# Patient Record
Sex: Male | Born: 1940
Health system: Southern US, Community
[De-identification: ages and names within clinical notes are randomized; demographics above are authoritative.]

## PROBLEM LIST (undated history)

## (undated) DIAGNOSIS — Z85828 Personal history of other malignant neoplasm of skin: Secondary | ICD-10-CM

## (undated) DIAGNOSIS — R350 Frequency of micturition: Secondary | ICD-10-CM

## (undated) DIAGNOSIS — R351 Nocturia: Secondary | ICD-10-CM

## (undated) DIAGNOSIS — C679 Malignant neoplasm of bladder, unspecified: Secondary | ICD-10-CM

## (undated) DIAGNOSIS — Z972 Presence of dental prosthetic device (complete) (partial): Secondary | ICD-10-CM

## (undated) DIAGNOSIS — Z9289 Personal history of other medical treatment: Secondary | ICD-10-CM

## (undated) HISTORY — DX: Malignant neoplasm of bladder, unspecified: C67.9

## (undated) HISTORY — PX: DUPUYTREN CONTRACTURE RELEASE: SHX1478

---

## 1998-06-28 HISTORY — PX: CATARACT EXTRACTION: SUR2

## 2006-01-13 ENCOUNTER — Ambulatory Visit: Payer: Self-pay | Admitting: Gastroenterology

## 2006-01-13 ENCOUNTER — Ambulatory Visit (HOSPITAL_COMMUNITY): Admission: RE | Admit: 2006-01-13 | Discharge: 2006-01-13 | Payer: Self-pay | Admitting: Gastroenterology

## 2010-09-15 ENCOUNTER — Other Ambulatory Visit: Payer: Self-pay

## 2010-09-15 DIAGNOSIS — D229 Melanocytic nevi, unspecified: Secondary | ICD-10-CM

## 2010-09-15 DIAGNOSIS — C4491 Basal cell carcinoma of skin, unspecified: Secondary | ICD-10-CM

## 2010-09-15 HISTORY — DX: Basal cell carcinoma of skin, unspecified: C44.91

## 2010-09-15 HISTORY — DX: Melanocytic nevi, unspecified: D22.9

## 2010-09-18 ENCOUNTER — Encounter (HOSPITAL_BASED_OUTPATIENT_CLINIC_OR_DEPARTMENT_OTHER)
Admission: RE | Admit: 2010-09-18 | Discharge: 2010-09-18 | Disposition: A | Discharge status: Home / Self Care | Payer: Medicare Other | Source: Ambulatory Visit | Attending: Orthopedic Surgery | Admitting: Orthopedic Surgery

## 2010-09-22 ENCOUNTER — Ambulatory Visit (HOSPITAL_BASED_OUTPATIENT_CLINIC_OR_DEPARTMENT_OTHER)
Admission: RE | Admit: 2010-09-22 | Discharge: 2010-09-22 | Disposition: A | Discharge status: Home / Self Care | Payer: Medicare Other | Source: Ambulatory Visit | Attending: Orthopedic Surgery | Admitting: Orthopedic Surgery

## 2010-09-22 ENCOUNTER — Other Ambulatory Visit: Payer: Self-pay | Admitting: Orthopedic Surgery

## 2010-09-22 DIAGNOSIS — M720 Palmar fascial fibromatosis [Dupuytren]: Secondary | ICD-10-CM | POA: Insufficient documentation

## 2010-09-22 DIAGNOSIS — Z01818 Encounter for other preprocedural examination: Secondary | ICD-10-CM | POA: Insufficient documentation

## 2010-12-31 ENCOUNTER — Encounter: Payer: Self-pay | Admitting: Gastroenterology

## 2011-01-15 ENCOUNTER — Ambulatory Visit: Payer: Medicare Other | Admitting: Urgent Care

## 2011-01-15 ENCOUNTER — Telehealth: Payer: Self-pay | Admitting: Urgent Care

## 2011-01-15 NOTE — Op Note (Signed)
NAME:  Curtis Patel, Curtis Patel NO.:  [id number]  MEDICAL RECORD NO.:  [id number]           PATIENT TYPE:  LOCATION:                                 FACILITY:  PHYSICIAN:  Cindee Salt, M.D.            DATE OF BIRTH:  DATE OF PROCEDURE:  09/22/2010 DATE OF DISCHARGE:                              OPERATIVE REPORT   PREOPERATIVE DIAGNOSIS:  Dupuytren contracture left ring and little fingers with first webspace cord.  POSTOPERATIVE DIAGNOSIS:  Dupuytren contracture left ring and little fingers with first webspace cord.  OPERATION:  Excision of palmar fascia, left ring, left small with VY advancements, excision of cord first webspace.  SURGEON:  Cindee Salt, MD  ASSISTANT:  Betha Loa, MD  ANESTHESIA:  Axillary general.  ANESTHESIOLOGIST:  Janetta Hora. Frederick, MD  HISTORY:  The patient is a 70 year old male with a history of Dupuytren contracture to the ring and little fingers of his left hand and a first webspace cord.  He has elected to undergo surgical excision.  He is aware of other procedures including aponeurotomy, Xiaflex collagenase injection, but has elected to undergo fasciectomy.  Pre, peri, postoperative course have been discussed along with risks and complications.  He is aware that there is no guarantee with the surgery, possibility of infection, recurrence of injury to arteries, nerves, tendons, incomplete relief of symptoms, dystrophy, the possibility of stiffness to the fingers.  In the preoperative area, the patient is seen, the extremity marked by both the patient and surgeon, antibiotic given.  PROCEDURE:  The patient was brought to the operating room where an axillary block was carried out without difficulty.  He was prepped using ChloraPrep, supine position, left arm free.  A 3-minute dry time was allowed.  A general anesthetic also given under the direction of Dr. Gelene Mink.  The limb was exsanguinated with an Esmarch bandage tourniquet  placed high on the arm was inflated to 250 mmHg.  After draping, the volar incision was made, Lorrene Reid in nature, carried down through subcutaneous tissue.  Bleeders were electrocauterized with bipolar.  The palmar fascia was identified proximally at its origin from the flexor retinaculum.  This was incised over the entire distal aspect of the flexor retinaculum.  Detaching all the palmar fascia from its proximal attachment, the neurovascular structures were identified after identifying the superficial palmar arch.  The cords to the ring and little fingers were then each isolated.  Neurovascular structures were identified.  The dissection carried distally on the little finger with a volar Brunner-type incision.  A lateral digital sheet cord coming from the abductor digiti quinti was identified.  A spiral nerve was present on the ulnar digital neurovascular bundle.  This was identified and traced through the cord excising the cord out to the level of the middle phalanx.  The radial digital artery and nerve were also identified.  The radial digital artery was of the major artery.  This was preserved along with the nerve on the radial side.  The specimen was sent to pathology. The finger came entirely straight.  A separate incision was  then made over the ring finger metacarpophalangeal joint.  The cord proximally was identified, traced distally to its attachment at the A2 pulley. Neurovascular structures were identified and protected, and the cord excised also sent to pathology.  Separate incision was then made over the first webspace, carried down through subcutaneous tissue.  The first webspace cord was identified, dissected free, and excised and sent.  The wounds were copiously irrigated with saline.  The V's converted to Y's on the little finger.  These were then copiously irrigated, sprayed with thrombin spray.  Vessel loop drains were placed into the depths of the wound, doubled over,  brought out proximally, and the wounds closed with interrupted 4-0 Vicryl Rapide sutures.  A sterile compressive dressing was applied.  On deflation of the tourniquet, all fingers immediately pinked.  A sterile splint was then attached, and the patient was taken to the recovery room for observation in satisfactory condition.  He will be discharged home to return to the [facility] of [place] in 1 week on Vicodin.          ______________________________ Cindee Salt, M.D.     GK/MEDQ  D:  09/22/2010  T:  09/23/2010  Job:  [id number]  Electronically Signed by Cindee Salt M.D. on 01/15/2011 09:04:33 AM

## 2011-01-15 NOTE — Telephone Encounter (Signed)
If new pt, please let referring MD know. Thanks

## 2012-03-28 DIAGNOSIS — Z23 Encounter for immunization: Secondary | ICD-10-CM | POA: Diagnosis not present

## 2012-07-13 DIAGNOSIS — D485 Neoplasm of uncertain behavior of skin: Secondary | ICD-10-CM | POA: Diagnosis not present

## 2012-07-13 DIAGNOSIS — L820 Inflamed seborrheic keratosis: Secondary | ICD-10-CM | POA: Diagnosis not present

## 2012-07-13 DIAGNOSIS — L570 Actinic keratosis: Secondary | ICD-10-CM | POA: Diagnosis not present

## 2012-07-13 DIAGNOSIS — Z85828 Personal history of other malignant neoplasm of skin: Secondary | ICD-10-CM | POA: Diagnosis not present

## 2012-10-18 DIAGNOSIS — L821 Other seborrheic keratosis: Secondary | ICD-10-CM | POA: Diagnosis not present

## 2012-10-18 DIAGNOSIS — L570 Actinic keratosis: Secondary | ICD-10-CM | POA: Diagnosis not present

## 2013-04-19 DIAGNOSIS — Z23 Encounter for immunization: Secondary | ICD-10-CM | POA: Diagnosis not present

## 2013-04-25 DIAGNOSIS — Z85828 Personal history of other malignant neoplasm of skin: Secondary | ICD-10-CM | POA: Diagnosis not present

## 2013-04-25 DIAGNOSIS — L821 Other seborrheic keratosis: Secondary | ICD-10-CM | POA: Diagnosis not present

## 2013-04-25 DIAGNOSIS — L570 Actinic keratosis: Secondary | ICD-10-CM | POA: Diagnosis not present

## 2013-09-19 ENCOUNTER — Ambulatory Visit (INDEPENDENT_AMBULATORY_CARE_PROVIDER_SITE_OTHER): Payer: Medicare Other | Admitting: Family Medicine

## 2013-09-19 VITALS — BP 122/60 | HR 79 | Temp 97.9°F | Resp 16 | Ht 67.0 in | Wt 139.0 lb

## 2013-09-19 DIAGNOSIS — S0180XA Unspecified open wound of other part of head, initial encounter: Secondary | ICD-10-CM | POA: Diagnosis not present

## 2013-09-19 DIAGNOSIS — Z23 Encounter for immunization: Secondary | ICD-10-CM | POA: Diagnosis not present

## 2013-09-19 DIAGNOSIS — R51 Headache: Secondary | ICD-10-CM

## 2013-09-19 DIAGNOSIS — R519 Headache, unspecified: Secondary | ICD-10-CM

## 2013-09-19 DIAGNOSIS — S0181XA Laceration without foreign body of other part of head, initial encounter: Secondary | ICD-10-CM

## 2013-09-19 NOTE — Patient Instructions (Signed)
Wound care as directed. Return for sutures to be removed in about 5-6 days  Tylenol or ibuprofen if needed for pain

## 2013-09-19 NOTE — Progress Notes (Signed)
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 A piece of lumber swung down and hit him in the right upper cheek this morning. It bled significantly. He was not knocked out. His eye was not affected.  Is last tetanus shot was at least 15 years ago. We discussed getting the TDAP and explained that the pertussis containing vaccine might not be covered by Medicare. He is willing to pay for whenever he might have to have.  Objective:  Pleasant gentleman no acute distress. Wound on his right cheek, 107mm, slightly stellate.  Moderatly deep to cheek bone.. facial movement intact  Assessment: Wound right cheek  Plan: TDAP Sutures

## 2013-09-19 NOTE — Progress Notes (Signed)
   Patient ID: GLEB MCGUIRE MRN: [id number], DOB: 1940/07/23, 73 y.o. Date of Encounter: 09/19/2013, 10:35 AM   PROCEDURE NOTE: Verbal consent obtained.  Risks and benefits of the procedure were explained. Patient made an informed decision to proceed with the procedure. Sterile technique employed. Numbing: Anesthesia obtained with 2% lidocaine with epi 2 cc for local anesthesia.   Cleansed with soap and water. Irrigated. Betadine prep per usual protocol.  Wound explored, no deep structures involved, no foreign bodies.   Wound repaired with # 7 simple interrupted sutures of Ethilon.  Hemostasis obtained. Wound cleansed and dressed.  Wound care instructions including precautions covered with patient. Handout given.  Anticipate suture removal in 5 days.   Signed, Christell Faith, MHS, PA-C [facility],  [id number] [place] [facility] 09/19/2013 10:35 AM

## 2013-09-25 ENCOUNTER — Ambulatory Visit (INDEPENDENT_AMBULATORY_CARE_PROVIDER_SITE_OTHER): Payer: Medicare Other | Admitting: Physician Assistant

## 2013-09-25 VITALS — BP 128/70 | HR 64 | Temp 97.8°F | Resp 16 | Ht 65.5 in | Wt 138.6 lb

## 2013-09-25 DIAGNOSIS — S0181XA Laceration without foreign body of other part of head, initial encounter: Secondary | ICD-10-CM

## 2013-09-25 DIAGNOSIS — S0180XA Unspecified open wound of other part of head, initial encounter: Secondary | ICD-10-CM

## 2013-09-25 NOTE — Progress Notes (Signed)
   Subjective:    Patient ID: Curtis Patel, male    DOB: 25-Apr-1941, 73 y.o.   MRN: [id number]  Suture / Staple Removal   Patient presents for suture removal. DOI 09/19/13. Wound is on the right temple. Well healing without erythema, warmth, or drainage. Patient has no other concerns or complaints.     Review of Systems  Constitutional: Negative for fever and chills.  Gastrointestinal: Negative for nausea and vomiting.  Skin: Positive for wound. Negative for color change.       Objective:   Physical Exam  Constitutional: He is oriented to person, place, and time. He appears well-developed and well-nourished.  HENT:  Head: Normocephalic and atraumatic.  Right Ear: External ear normal.  Left Ear: External ear normal.  Eyes: Conjunctivae are normal.  Neck: Normal range of motion.  Cardiovascular: Normal rate.   Pulmonary/Chest: Effort normal.  Neurological: He is alert and oriented to person, place, and time.  Skin:  Wound on right temple well healing without erythema, warmth, or drainage.   Psychiatric: He has a normal mood and affect. His behavior is normal. Judgment and thought content normal.     #7 sutures removed without difficulty. Patient tolerated well.      Assessment & Plan:   Laceration of face Sutures removed Wound well healing.  Follow up as needed.

## 2015-11-27 DIAGNOSIS — M720 Palmar fascial fibromatosis [Dupuytren]: Secondary | ICD-10-CM | POA: Diagnosis not present

## 2015-12-01 DIAGNOSIS — M720 Palmar fascial fibromatosis [Dupuytren]: Secondary | ICD-10-CM | POA: Diagnosis not present

## 2015-12-03 DIAGNOSIS — M720 Palmar fascial fibromatosis [Dupuytren]: Secondary | ICD-10-CM | POA: Diagnosis not present

## 2015-12-18 DIAGNOSIS — M720 Palmar fascial fibromatosis [Dupuytren]: Secondary | ICD-10-CM | POA: Diagnosis not present

## 2016-07-28 ENCOUNTER — Ambulatory Visit (INDEPENDENT_AMBULATORY_CARE_PROVIDER_SITE_OTHER): Payer: Medicare Other | Admitting: Family Medicine

## 2016-07-28 ENCOUNTER — Encounter: Payer: Self-pay | Admitting: Family Medicine

## 2016-07-28 ENCOUNTER — Ambulatory Visit (INDEPENDENT_AMBULATORY_CARE_PROVIDER_SITE_OTHER): Payer: Medicare Other

## 2016-07-28 VITALS — BP 136/72 | HR 65 | Temp 97.1°F | Ht 65.5 in | Wt 139.0 lb

## 2016-07-28 DIAGNOSIS — R0602 Shortness of breath: Secondary | ICD-10-CM

## 2016-07-28 DIAGNOSIS — R5383 Other fatigue: Secondary | ICD-10-CM | POA: Diagnosis not present

## 2016-07-28 DIAGNOSIS — Z136 Encounter for screening for cardiovascular disorders: Secondary | ICD-10-CM | POA: Diagnosis not present

## 2016-07-28 DIAGNOSIS — Z125 Encounter for screening for malignant neoplasm of prostate: Secondary | ICD-10-CM

## 2016-07-28 DIAGNOSIS — Z13220 Encounter for screening for lipoid disorders: Secondary | ICD-10-CM | POA: Diagnosis not present

## 2016-07-28 LAB — URINALYSIS, COMPLETE
BILIRUBIN UA: NEGATIVE
GLUCOSE, UA: NEGATIVE
KETONES UA: NEGATIVE
Leukocytes, UA: NEGATIVE
Nitrite, UA: NEGATIVE
PH UA: 7 (ref 5.0–7.5)
PROTEIN UA: NEGATIVE
SPEC GRAV UA: 1.01 (ref 1.005–1.030)
UUROB: 1 mg/dL (ref 0.2–1.0)

## 2016-07-28 LAB — MICROSCOPIC EXAMINATION
Bacteria, UA: NONE SEEN
Renal Epithel, UA: NONE SEEN /hpf
WBC UA: NONE SEEN /HPF (ref 0–?)

## 2016-07-28 NOTE — Progress Notes (Signed)
Subjective:  Patient ID: Curtis Patel, male    DOB: Feb 19, 1941  Age: 76 y.o. MRN: [id number]  CC: New Patient (Initial Visit) (pt here today to establish care but is also c/o a little SOB while hiking )   HPI Curtis Patel presents for Patient has been in remarkably good health. He has no serious medical conditions. He did have a cataract surgery about 5-8 years ago. He tells the nurse that was his right eye but he pointed to his left.  2 weeks ago today he was hiking on [place]. He was 3000 feet. The temperature was 13F or below. He developed some shortness of breath which was very unusual for him. He had to lay down and catch his breath for little bit time he felt rather dizzy. There was a retired physician with him and the physician felt that he might be having heart trouble or a stroke. However the patient after a short time perhaps 10-15 minutes and was able to get up and continue the hike. He felt rather rundown and tired compared to usual. Otherwise normal. He never had any shortness of breath after that point and he never had chest pain during the entire hike or during the incident. He hikes regularly about once a week 8-10 miles. He did not hike today because of the doctor's appointment but normally he would've been on the trail again today. The morning of the above-mentioned incident he states that he failed to hydrate properly he had a couple of cups of coffee but no water. The patient denies any loss of consciousness in spite of feeling somewhat lightheaded. He did not have any swelling. No leg pain. No nausea vomiting diarrhea. See review of systems.  History Curtis Patel has no past medical history on file.   He has a past surgical history that includes Eye surgery (Right).   His family history is not on file.He reports that he has never smoked. He has never used smokeless tobacco. He reports that he does not drink alcohol or use drugs.  No current outpatient  prescriptions on file prior to visit.   No current facility-administered medications on file prior to visit.     ROS Review of Systems  Constitutional: Negative for activity change, appetite change, chills, diaphoresis, fatigue, fever and unexpected weight change.  HENT: Negative for congestion, ear pain, hearing loss, postnasal drip, rhinorrhea, sore throat, tinnitus and trouble swallowing.   Eyes: Negative for photophobia, pain, discharge and redness.  Respiratory: Positive for shortness of breath. Negative for apnea, cough, choking, chest tightness, wheezing and stridor.   Cardiovascular: Negative for chest pain, palpitations and leg swelling.  Gastrointestinal: Negative for abdominal distention, abdominal pain, blood in stool, constipation, diarrhea, nausea and vomiting.  Endocrine: Negative for cold intolerance, heat intolerance, polydipsia, polyphagia and polyuria.  Genitourinary: Negative for difficulty urinating, dysuria, enuresis, flank pain, frequency, genital sores, hematuria and urgency.  Musculoskeletal: Negative for arthralgias and joint swelling.  Skin: Negative for color change, rash and wound.  Allergic/Immunologic: Negative for immunocompromised state.  Neurological: Negative for dizziness, tremors, seizures, syncope, facial asymmetry, speech difficulty, weakness, light-headedness, numbness and headaches.  Hematological: Does not bruise/bleed easily.  Psychiatric/Behavioral: Negative for agitation, behavioral problems, confusion, decreased concentration, dysphoric mood, hallucinations, sleep disturbance and suicidal ideas. The patient is not nervous/anxious and is not hyperactive.     Objective:  BP 136/72   Pulse 65   Temp 97.1 F (36.2 C) (Oral)   Ht 5' 5.5" (1.664 m)  Wt 139 lb (63 kg)   BMI 22.78 kg/m   Physical Exam  Constitutional: He is oriented to person, place, and time. He appears well-developed and well-nourished.  HENT:  Head: Normocephalic and  atraumatic.  Mouth/Throat: Oropharynx is clear and moist.  Eyes: EOM are normal. Pupils are equal, round, and reactive to light.  Neck: Normal range of motion. No tracheal deviation present. No thyromegaly present.  Cardiovascular: Normal rate, regular rhythm and normal heart sounds.  Exam reveals no gallop and no friction rub.   No murmur heard. Pulmonary/Chest: Breath sounds normal. He has no wheezes. He has no rales.  Abdominal: Soft. He exhibits no mass. There is no tenderness.  Musculoskeletal: Normal range of motion. He exhibits no edema.  Neurological: He is alert and oriented to person, place, and time.  Skin: Skin is warm and dry.  Psychiatric: He has a normal mood and affect.    Assessment & Plan:   Curtis Patel was seen today for new patient (initial visit).  Diagnoses and all orders for this visit:  Shortness of breath -     EKG 12-Lead -     DG Chest 2 View; Future -     CBC with Differential/Platelet -     CMP14+EGFR -     D-dimer, quantitative (not at [facility]) -     Lipid panel -     PR BREATHING CAPACITY TEST -     PSA Total (Reflex To Free) -     TSH -     Urinalysis, Complete -     ECHOCARDIOGRAM STRESS TEST; Future  Fatigue, unspecified type -     TSH  Encounter for lipid screening for cardiovascular disease -     Lipid panel  Other orders -     Microscopic Examination -     %fPSA Reflex   Curtis Patel does not currently have medications on file.  No orders of the defined types were placed in this encounter.    Follow-up: Return in about 2 weeks (around 08/11/2016).  Claretta Fraise, M.D.

## 2016-07-29 LAB — LIPID PANEL
CHOLESTEROL TOTAL: 196 mg/dL (ref 100–199)
Chol/HDL Ratio: 4.9 ratio units (ref 0.0–5.0)
HDL: 40 mg/dL (ref >39)
LDL CALC: 140 mg/dL — AB (ref 0–99)
Triglycerides: 79 mg/dL (ref 0–149)
VLDL CHOLESTEROL CAL: 16 mg/dL (ref 5–40)

## 2016-07-29 LAB — FPSA% REFLEX
% FREE PSA: 14.9 %
PSA, FREE: 0.64 ng/mL

## 2016-07-29 LAB — CBC WITH DIFFERENTIAL/PLATELET
Basophils Absolute: 0 10*3/uL (ref 0.0–0.2)
Basos: 0 %
EOS (ABSOLUTE): 0.1 10*3/uL (ref 0.0–0.4)
EOS: 1 %
HEMATOCRIT: 41 % (ref 37.5–51.0)
HEMOGLOBIN: 14.4 g/dL (ref 13.0–17.7)
Immature Grans (Abs): 0 10*3/uL (ref 0.0–0.1)
Immature Granulocytes: 0 %
LYMPHS ABS: 2.5 10*3/uL (ref 0.7–3.1)
Lymphs: 28 %
MCH: 29.6 pg (ref 26.6–33.0)
MCHC: 35.1 g/dL (ref 31.5–35.7)
MCV: 84 fL (ref 79–97)
MONOCYTES: 7 %
Monocytes Absolute: 0.6 10*3/uL (ref 0.1–0.9)
NEUTROS ABS: 5.8 10*3/uL (ref 1.4–7.0)
Neutrophils: 64 %
Platelets: 264 10*3/uL (ref 150–379)
RBC: 4.87 x10E6/uL (ref 4.14–5.80)
RDW: 13.4 % (ref 12.3–15.4)
WBC: 9 10*3/uL (ref 3.4–10.8)

## 2016-07-29 LAB — CMP14+EGFR
ALBUMIN: 4.4 g/dL (ref 3.5–4.8)
ALK PHOS: 95 IU/L (ref 39–117)
ALT: 12 IU/L (ref 0–44)
AST: 15 IU/L (ref 0–40)
Albumin/Globulin Ratio: 2.1 (ref 1.2–2.2)
BILIRUBIN TOTAL: 0.4 mg/dL (ref 0.0–1.2)
BUN / CREAT RATIO: 15 (ref 10–24)
BUN: 15 mg/dL (ref 8–27)
CO2: 22 mmol/L (ref 18–29)
CREATININE: 0.97 mg/dL (ref 0.76–1.27)
Calcium: 9.2 mg/dL (ref 8.6–10.2)
Chloride: 100 mmol/L (ref 96–106)
GFR calc non Af Amer: 76 mL/min/{1.73_m2} (ref >59)
GFR, EST AFRICAN AMERICAN: 88 mL/min/{1.73_m2} (ref >59)
GLOBULIN, TOTAL: 2.1 g/dL (ref 1.5–4.5)
Glucose: 82 mg/dL (ref 65–99)
Potassium: 4.6 mmol/L (ref 3.5–5.2)
SODIUM: 141 mmol/L (ref 134–144)
TOTAL PROTEIN: 6.5 g/dL (ref 6.0–8.5)

## 2016-07-29 LAB — D-DIMER, QUANTITATIVE (NOT AT ARMC): D-DIMER: 0.51 mg{FEU}/L — AB (ref 0.00–0.49)

## 2016-07-29 LAB — PSA TOTAL (REFLEX TO FREE): Prostate Specific Ag, Serum: 4.3 ng/mL — ABNORMAL HIGH (ref 0.0–4.0)

## 2016-07-29 LAB — TSH: TSH: 4.48 u[IU]/mL (ref 0.450–4.500)

## 2016-08-03 ENCOUNTER — Other Ambulatory Visit: Payer: Medicare Other

## 2016-08-03 DIAGNOSIS — R319 Hematuria, unspecified: Secondary | ICD-10-CM

## 2016-08-03 LAB — URINALYSIS, COMPLETE
Bilirubin, UA: NEGATIVE
Glucose, UA: NEGATIVE
Ketones, UA: NEGATIVE
Leukocytes, UA: NEGATIVE
NITRITE UA: NEGATIVE
PH UA: 5.5 (ref 5.0–7.5)
PROTEIN UA: NEGATIVE
Specific Gravity, UA: 1.01 (ref 1.005–1.030)
UUROB: 0.2 mg/dL (ref 0.2–1.0)

## 2016-08-03 LAB — MICROSCOPIC EXAMINATION
BACTERIA UA: NONE SEEN
EPITHELIAL CELLS (NON RENAL): NONE SEEN /HPF (ref 0–10)
Renal Epithel, UA: NONE SEEN /hpf

## 2016-08-05 ENCOUNTER — Encounter: Payer: Self-pay | Admitting: *Deleted

## 2016-08-17 ENCOUNTER — Ambulatory Visit (INDEPENDENT_AMBULATORY_CARE_PROVIDER_SITE_OTHER): Payer: Medicare Other | Admitting: Family Medicine

## 2016-08-17 ENCOUNTER — Encounter: Payer: Self-pay | Admitting: Family Medicine

## 2016-08-17 VITALS — BP 140/74 | HR 78 | Temp 97.4°F | Ht 65.5 in | Wt 139.4 lb

## 2016-08-17 DIAGNOSIS — R3129 Other microscopic hematuria: Secondary | ICD-10-CM

## 2016-08-17 DIAGNOSIS — R0602 Shortness of breath: Secondary | ICD-10-CM | POA: Diagnosis not present

## 2016-08-17 LAB — URINALYSIS, COMPLETE
BILIRUBIN UA: NEGATIVE
Glucose, UA: NEGATIVE
KETONES UA: NEGATIVE
Leukocytes, UA: NEGATIVE
Nitrite, UA: NEGATIVE
PROTEIN UA: NEGATIVE
UUROB: 0.2 mg/dL (ref 0.2–1.0)
pH, UA: 5 (ref 5.0–7.5)

## 2016-08-17 LAB — MICROSCOPIC EXAMINATION
Bacteria, UA: NONE SEEN
Epithelial Cells (non renal): NONE SEEN /hpf (ref 0–10)
RENAL EPITHEL UA: NONE SEEN /HPF
WBC UA: NONE SEEN /HPF (ref 0–?)

## 2016-08-17 NOTE — Progress Notes (Signed)
Subjective:  Patient ID: Curtis Curtis Patel Curtis Patel, male    DOB: December 26, 1940  Age: 76 y.o. MRN: [id number]  CC: Shortness of Breath (better) and Urinary Tract Infection (did fu urine sample)   HPI Curtis Curtis Patel Curtis Patel presents for Recheck of his episode of near syncope. This had occurred prior to his previous visit. He was noted at time of his office visit at that time that he had some hematuria microscopically. He came in for repeat several days later. And in for final today. Of note is that he has had no further episodes of shortness of breath. He has not been hiking. He is keeping himself well hydrated though. He wants to go back tomorrow to hike on the [place] near [place]. His stress test has not been performed as yet. It is scheduled for early March. He has not had any chest pain, syncope or dyspnea. He has not had any hematuria.   History Curtis Curtis Patel Curtis Patel has no past medical history on file.   He has a past surgical history that includes Eye surgery (Right).   His family history is not on file.He reports that he has never smoked. He has never used smokeless tobacco. He reports that he does not drink alcohol or use drugs.    ROS Review of Systems  Constitutional: Negative for chills, diaphoresis and fever.  HENT: Negative for rhinorrhea and sore throat.   Respiratory: Negative for cough and shortness of breath.   Cardiovascular: Negative for chest pain.  Gastrointestinal: Negative for abdominal pain.  Musculoskeletal: Negative for arthralgias and myalgias.  Skin: Negative for rash.  Neurological: Negative for weakness and headaches.    Objective:  BP 140/74   Pulse 78   Temp 97.4 F (36.3 C) (Oral)   Ht 5' 5.5" (1.664 m)   Wt 139 lb 6.4 oz (63.2 kg)   BMI 22.84 kg/m   BP Readings from Last 3 Encounters:  08/17/16 140/74  07/28/16 136/72  09/25/13 128/70    Wt Readings from Last 3 Encounters:  08/17/16 139 lb 6.4 oz (63.2 kg)  07/28/16 139 lb (63 kg)  09/25/13 138  lb 9.6 oz (62.9 kg)     Physical Exam  Constitutional: He is oriented to person, place, and time. He appears well-developed and well-nourished. No distress.  HENT:  Head: Normocephalic and atraumatic.  Right Ear: External ear normal.  Left Ear: External ear normal.  Nose: Nose normal.  Mouth/Throat: Oropharynx is clear and moist.  Eyes: Conjunctivae and EOM are normal. Pupils are equal, round, and reactive to light.  Neck: Normal range of motion. Neck supple. No thyromegaly present.  Cardiovascular: Normal rate, regular rhythm and normal heart sounds.   No murmur heard. Pulmonary/Chest: Effort normal and breath sounds normal. No respiratory distress. He has no wheezes. He has no rales.  Abdominal: Soft. Bowel sounds are normal. He exhibits no distension. There is no tenderness.  Lymphadenopathy:    He has no cervical adenopathy.  Neurological: He is alert and oriented to person, place, and time. He has normal reflexes.  Skin: Skin is warm and dry.  Psychiatric: He has a normal mood and affect. His behavior is normal. Judgment and thought content normal.    No results found.  Assessment & Plan:   Curtis Curtis Patel Curtis Patel was seen today for shortness of breath and urinary tract infection.  Diagnoses and all orders for this visit:  Other microscopic hematuria -     Urinalysis, Complete  Shortness of breath  Mr. Curtis Curtis Patel Curtis Patel does not currently have medications on file.  Allergies as of 08/17/2016   No Known Allergies     Medication List    as of 08/17/2016  1:53 PM   You have not been prescribed any medications.      Follow-up: Return in about 6 months (around 02/14/2017).  Claretta Fraise, M.D.

## 2016-08-19 ENCOUNTER — Telehealth (HOSPITAL_COMMUNITY): Payer: Self-pay | Admitting: *Deleted

## 2016-08-19 NOTE — Telephone Encounter (Signed)
Left message on voicemail per DPR in reference to upcoming appointment scheduled on 08/26/16 at 7:30 with detailed instructions given per Stress Test Requisition Sheet for the test. LM to arrive 30 minutes early, and that it is imperative to arrive on time for appointment to keep from having the test rescheduled. If you need to cancel or reschedule your appointment, please call the office within 24 hours of your appointment. Failure to do so may result in a cancellation of your appointment, and a $50 no show fee. Phone number given for call back for any questions. Curtis Patel

## 2016-08-20 ENCOUNTER — Other Ambulatory Visit: Payer: Self-pay

## 2016-08-20 DIAGNOSIS — R319 Hematuria, unspecified: Secondary | ICD-10-CM

## 2016-08-26 ENCOUNTER — Ambulatory Visit (HOSPITAL_COMMUNITY): Payer: Medicare Other | Attending: Cardiology

## 2016-08-26 ENCOUNTER — Ambulatory Visit (HOSPITAL_COMMUNITY): Payer: Medicare Other

## 2016-08-26 DIAGNOSIS — R0602 Shortness of breath: Secondary | ICD-10-CM | POA: Diagnosis not present

## 2016-08-26 DIAGNOSIS — Z9289 Personal history of other medical treatment: Secondary | ICD-10-CM

## 2016-08-26 HISTORY — DX: Personal history of other medical treatment: Z92.89

## 2016-12-28 DIAGNOSIS — R3121 Asymptomatic microscopic hematuria: Secondary | ICD-10-CM | POA: Diagnosis not present

## 2017-01-04 DIAGNOSIS — R3121 Asymptomatic microscopic hematuria: Secondary | ICD-10-CM | POA: Diagnosis not present

## 2017-01-04 DIAGNOSIS — N200 Calculus of kidney: Secondary | ICD-10-CM | POA: Diagnosis not present

## 2017-02-14 DIAGNOSIS — R3121 Asymptomatic microscopic hematuria: Secondary | ICD-10-CM | POA: Diagnosis not present

## 2017-02-16 ENCOUNTER — Other Ambulatory Visit: Payer: Self-pay | Admitting: Urology

## 2017-02-24 NOTE — Patient Instructions (Addendum)
Curtis Patel  02/24/2017   Your procedure is scheduled on: 03/09/2017    Report to [facility] Main  Entrance Take [place]  elevators to 3rd floor to  [place] at     200pm     Call this number if you have problems the morning of surgery [phone]    Remember: ONLY 1 PERSON MAY GO WITH YOU TO SHORT STAY TO GET  READY MORNING OF YOUR SURGERY.  Do not eat food after midnite.  May have clear liquids from 12 midnite until 1000am morning of surgery then nothing by mouth      Take these medicines the morning of surgery with A SIP OF WATER: none                                 You may not have any metal on your body including hair pins and              piercings  Do not wear jewelry, , lotions, powders or perfumes, deodorant                          Men may shave face and neck.   Do not bring valuables to the hospital. [place].  Contacts, dentures or bridgework may not be worn into surgery.       Patients discharged the day of surgery will not be allowed to drive home.  Name and phone number of your driver:                Please read over the following fact sheets you were given: _____________________________________________________________________             [facility] - Preparing for Surgery Before surgery, you can play an important role.  Because skin is not sterile, your skin needs to be as free of germs as possible.  You can reduce the number of germs on your skin by washing with CHG (chlorahexidine gluconate) soap before surgery.  CHG is an antiseptic cleaner which kills germs and bonds with the skin to continue killing germs even after washing. Please DO NOT use if you have an allergy to CHG or antibacterial soaps.  If your skin becomes reddened/irritated stop using the CHG and inform your nurse when you arrive at Short Stay. Do not shave (including legs and underarms) for at least 48  hours prior to the first CHG shower.  You may shave your face/neck. Please follow these instructions carefully:  1.  Shower with CHG Soap the night before surgery and the  morning of Surgery.  2.  If you choose to wash your hair, wash your hair first as usual with your  normal  shampoo.  3.  After you shampoo, rinse your hair and body thoroughly to remove the  shampoo.                           4.  Use CHG as you would any other liquid soap.  You can apply chg directly  to the skin and wash  Gently with a scrungie or clean washcloth.  5.  Apply the CHG Soap to your body ONLY FROM THE NECK DOWN.   Do not use on face/ open                           Wound or open sores. Avoid contact with eyes, ears mouth and genitals (private parts).                       Wash face,  Genitals (private parts) with your normal soap.             6.  Wash thoroughly, paying special attention to the area where your surgery  will be performed.  7.  Thoroughly rinse your body with warm water from the neck down.  8.  DO NOT shower/wash with your normal soap after using and rinsing off  the CHG Soap.                9.  Pat yourself dry with a clean towel.            10.  Wear clean pajamas.            11.  Place clean sheets on your bed the night of your first shower and do not  sleep with pets. Day of Surgery : Do not apply any lotions/deodorants the morning of surgery.  Please wear clean clothes to the hospital/surgery center.  FAILURE TO FOLLOW THESE INSTRUCTIONS MAY RESULT IN THE CANCELLATION OF YOUR SURGERY PATIENT SIGNATURE_________________________________  NURSE SIGNATURE__________________________________  ________________________________________________________________________    CLEAR LIQUID DIET   Foods Allowed                                                                     Foods Excluded  Coffee and tea, regular and decaf                             liquids that you cannot   Plain Jell-O in any flavor                                             see through such as: Fruit ices (not with fruit pulp)                                     milk, soups, orange juice  Iced Popsicles                                    All solid food Carbonated beverages, regular and diet                                    Cranberry, grape and apple juices Sports drinks like Gatorade Lightly seasoned clear broth or consume(fat free) Sugar, honey syrup  Sample Menu Breakfast                                Lunch                                     Supper Cranberry juice                    Beef broth                            Chicken broth Jell-O                                     Grape juice                           Apple juice Coffee or tea                        Jell-O                                      Popsicle                                                Coffee or tea                        Coffee or tea  _____________________________________________________________________

## 2017-03-01 ENCOUNTER — Encounter (HOSPITAL_COMMUNITY)
Admission: RE | Admit: 2017-03-01 | Discharge: 2017-03-01 | Disposition: A | Discharge status: Home / Self Care | Payer: Medicare Other | Source: Ambulatory Visit | Attending: Urology | Admitting: Urology

## 2017-03-01 ENCOUNTER — Encounter (INDEPENDENT_AMBULATORY_CARE_PROVIDER_SITE_OTHER): Payer: Self-pay

## 2017-03-01 ENCOUNTER — Encounter (HOSPITAL_COMMUNITY): Payer: Self-pay

## 2017-03-01 DIAGNOSIS — C679 Malignant neoplasm of bladder, unspecified: Secondary | ICD-10-CM | POA: Insufficient documentation

## 2017-03-01 DIAGNOSIS — Z01818 Encounter for other preprocedural examination: Secondary | ICD-10-CM | POA: Diagnosis not present

## 2017-03-01 LAB — CBC
HEMATOCRIT: 40.4 % (ref 39.0–52.0)
HEMOGLOBIN: 13.9 g/dL (ref 13.0–17.0)
MCH: 29.2 pg (ref 26.0–34.0)
MCHC: 34.4 g/dL (ref 30.0–36.0)
MCV: 84.9 fL (ref 78.0–100.0)
Platelets: 254 10*3/uL (ref 150–400)
RBC: 4.76 MIL/uL (ref 4.22–5.81)
RDW: 12.4 % (ref 11.5–15.5)
WBC: 6.7 10*3/uL (ref 4.0–10.5)

## 2017-03-01 NOTE — Progress Notes (Signed)
07/28/16-ekg-epic CXR-07/28/16-epic  ECHO-08/26/16-epic

## 2017-03-09 ENCOUNTER — Encounter (HOSPITAL_COMMUNITY): Payer: Self-pay | Admitting: Emergency Medicine

## 2017-03-09 ENCOUNTER — Ambulatory Visit (HOSPITAL_COMMUNITY): Payer: Medicare Other | Admitting: Anesthesiology

## 2017-03-09 ENCOUNTER — Encounter (HOSPITAL_COMMUNITY)
Admission: RE | Disposition: A | Discharge status: Home / Self Care | Payer: Self-pay | Source: Ambulatory Visit | Attending: Urology

## 2017-03-09 ENCOUNTER — Ambulatory Visit (HOSPITAL_COMMUNITY)
Admission: RE | Admit: 2017-03-09 | Discharge: 2017-03-09 | Disposition: A | Discharge status: Home / Self Care | Payer: Medicare Other | Source: Ambulatory Visit | Attending: Urology | Admitting: Urology

## 2017-03-09 ENCOUNTER — Ambulatory Visit (HOSPITAL_COMMUNITY): Payer: Medicare Other

## 2017-03-09 DIAGNOSIS — C672 Malignant neoplasm of lateral wall of bladder: Secondary | ICD-10-CM | POA: Diagnosis not present

## 2017-03-09 DIAGNOSIS — C670 Malignant neoplasm of trigone of bladder: Secondary | ICD-10-CM | POA: Diagnosis not present

## 2017-03-09 DIAGNOSIS — C679 Malignant neoplasm of bladder, unspecified: Secondary | ICD-10-CM | POA: Diagnosis not present

## 2017-03-09 DIAGNOSIS — Z7982 Long term (current) use of aspirin: Secondary | ICD-10-CM | POA: Diagnosis not present

## 2017-03-09 DIAGNOSIS — K219 Gastro-esophageal reflux disease without esophagitis: Secondary | ICD-10-CM | POA: Diagnosis not present

## 2017-03-09 DIAGNOSIS — D494 Neoplasm of unspecified behavior of bladder: Secondary | ICD-10-CM | POA: Diagnosis present

## 2017-03-09 DIAGNOSIS — Z87891 Personal history of nicotine dependence: Secondary | ICD-10-CM | POA: Diagnosis not present

## 2017-03-09 DIAGNOSIS — C678 Malignant neoplasm of overlapping sites of bladder: Secondary | ICD-10-CM | POA: Diagnosis not present

## 2017-03-09 HISTORY — PX: TRANSURETHRAL RESECTION OF BLADDER TUMOR: SHX2575

## 2017-03-09 HISTORY — PX: CYSTOSCOPY W/ URETERAL STENT PLACEMENT: SHX1429

## 2017-03-09 LAB — GLUCOSE, CAPILLARY: Glucose-Capillary: 96 mg/dL (ref 65–99)

## 2017-03-09 SURGERY — TURBT (TRANSURETHRAL RESECTION OF BLADDER TUMOR)
Anesthesia: General

## 2017-03-09 MED ORDER — PROPOFOL 10 MG/ML IV BOLUS
INTRAVENOUS | Status: DC | PRN
  Administered 2017-03-09: 140 mg via INTRAVENOUS

## 2017-03-09 MED ORDER — PHENYLEPHRINE 40 MCG/ML (10ML) SYRINGE FOR IV PUSH (FOR BLOOD PRESSURE SUPPORT)
PREFILLED_SYRINGE | INTRAVENOUS | Status: DC | PRN
  Administered 2017-03-09 (×2): 80 ug via INTRAVENOUS

## 2017-03-09 MED ORDER — TRAMADOL HCL 50 MG PO TABS: 50.0000 mg | ORAL_TABLET | Freq: Four times a day (QID) | ORAL | 0 refills | Status: DC | PRN

## 2017-03-09 MED ORDER — 0.9 % SODIUM CHLORIDE (POUR BTL) OPTIME
TOPICAL | Status: DC | PRN
  Administered 2017-03-09: 1000 mL

## 2017-03-09 MED ORDER — SENNOSIDES-DOCUSATE SODIUM 8.6-50 MG PO TABS: 1.0000 | ORAL_TABLET | Freq: Two times a day (BID) | ORAL | 0 refills | Status: DC

## 2017-03-09 MED ORDER — DEXAMETHASONE SODIUM PHOSPHATE 10 MG/ML IJ SOLN
INTRAMUSCULAR | Status: DC | PRN
  Administered 2017-03-09: 10 mg via INTRAVENOUS

## 2017-03-09 MED ORDER — PROPOFOL 10 MG/ML IV BOLUS
INTRAVENOUS | Status: AC
Start: 2017-03-09 — End: ?
  Filled 2017-03-09: qty 20

## 2017-03-09 MED ORDER — MEPERIDINE HCL 50 MG/ML IJ SOLN: 6.2500 mg | INTRAMUSCULAR | Status: DC | PRN

## 2017-03-09 MED ORDER — LIDOCAINE 2% (20 MG/ML) 5 ML SYRINGE
INTRAMUSCULAR | Status: DC | PRN
  Administered 2017-03-09: 100 mg via INTRAVENOUS

## 2017-03-09 MED ORDER — FENTANYL CITRATE (PF) 100 MCG/2ML IJ SOLN
INTRAMUSCULAR | Status: AC
  Filled 2017-03-09: qty 2

## 2017-03-09 MED ORDER — DEXAMETHASONE SODIUM PHOSPHATE 10 MG/ML IJ SOLN
INTRAMUSCULAR | Status: AC
  Filled 2017-03-09: qty 1

## 2017-03-09 MED ORDER — OXYCODONE HCL 5 MG/5ML PO SOLN
5.0000 mg | Freq: Once | ORAL | Status: DC | PRN
  Filled 2017-03-09: qty 5

## 2017-03-09 MED ORDER — OXYCODONE HCL 5 MG PO TABS: 5.0000 mg | ORAL_TABLET | Freq: Once | ORAL | Status: DC | PRN

## 2017-03-09 MED ORDER — SODIUM CHLORIDE 0.9 % IR SOLN
Status: DC | PRN
  Administered 2017-03-09: 9000 mL via INTRAVESICAL

## 2017-03-09 MED ORDER — ONDANSETRON HCL 4 MG/2ML IJ SOLN
INTRAMUSCULAR | Status: AC
  Filled 2017-03-09: qty 2

## 2017-03-09 MED ORDER — LACTATED RINGERS IV SOLN
INTRAVENOUS | Status: DC
  Administered 2017-03-09 (×2): via INTRAVENOUS

## 2017-03-09 MED ORDER — PROMETHAZINE HCL 25 MG/ML IJ SOLN: 6.2500 mg | INTRAMUSCULAR | Status: DC | PRN

## 2017-03-09 MED ORDER — FENTANYL CITRATE (PF) 100 MCG/2ML IJ SOLN
INTRAMUSCULAR | Status: DC | PRN
  Administered 2017-03-09: 25 ug via INTRAVENOUS
  Administered 2017-03-09: 50 ug via INTRAVENOUS
  Administered 2017-03-09: 25 ug via INTRAVENOUS

## 2017-03-09 MED ORDER — ONDANSETRON HCL 4 MG/2ML IJ SOLN
INTRAMUSCULAR | Status: DC | PRN
  Administered 2017-03-09: 4 mg via INTRAVENOUS

## 2017-03-09 MED ORDER — LIDOCAINE 2% (20 MG/ML) 5 ML SYRINGE
INTRAMUSCULAR | Status: AC
  Filled 2017-03-09: qty 5

## 2017-03-09 MED ORDER — HYDROMORPHONE HCL-NACL 0.5-0.9 MG/ML-% IV SOSY: 0.2500 mg | PREFILLED_SYRINGE | INTRAVENOUS | Status: DC | PRN

## 2017-03-09 MED ORDER — CEPHALEXIN 500 MG PO CAPS
500.0000 mg | ORAL_CAPSULE | Freq: Two times a day (BID) | ORAL | 0 refills | Status: AC
Start: 2017-03-09 — End: 2017-03-14

## 2017-03-09 MED ORDER — GENTAMICIN SULFATE 40 MG/ML IJ SOLN
5.0000 mg/kg | INTRAVENOUS | Status: AC
  Administered 2017-03-09: 300 mg via INTRAVENOUS
  Filled 2017-03-09: qty 7.5

## 2017-03-09 MED ORDER — IOHEXOL 300 MG/ML  SOLN
INTRAMUSCULAR | Status: DC | PRN
  Administered 2017-03-09: 15 mL via URETHRAL

## 2017-03-09 SURGICAL SUPPLY — 34 items
BAG URINE DRAINAGE (UROLOGICAL SUPPLIES) ×2 IMPLANT
BAG URO CATCHER STRL LF (MISCELLANEOUS) ×4 IMPLANT
BASKET ZERO TIP NITINOL 2.4FR (BASKET) IMPLANT
BSKT STON RTRVL ZERO TP 2.4FR (BASKET)
CATH FOLEY 3WAY 30CC 24FR (CATHETERS) ×4
CATH INTERMIT  6FR 70CM (CATHETERS) ×2 IMPLANT
CATH URTH STD 24FR FL 3W 2 (CATHETERS) IMPLANT
CLOTH BEACON ORANGE TIMEOUT ST (SAFETY) ×4 IMPLANT
COVER FOOTSWITCH UNIV (MISCELLANEOUS) ×2 IMPLANT
COVER SURGICAL LIGHT HANDLE (MISCELLANEOUS) ×2 IMPLANT
ELECT REM PT RETURN 15FT ADLT (MISCELLANEOUS) ×2 IMPLANT
EVACUATOR MICROVAS BLADDER (UROLOGICAL SUPPLIES) IMPLANT
FIBER LASER FLEXIVA 1000 (UROLOGICAL SUPPLIES) IMPLANT
FIBER LASER FLEXIVA 365 (UROLOGICAL SUPPLIES) IMPLANT
FIBER LASER FLEXIVA 550 (UROLOGICAL SUPPLIES) IMPLANT
FIBER LASER TRAC TIP (UROLOGICAL SUPPLIES) IMPLANT
GLOVE BIOGEL M STRL SZ7.5 (GLOVE) ×4 IMPLANT
GOWN STRL REUS W/TWL LRG LVL3 (GOWN DISPOSABLE) ×8 IMPLANT
GUIDEWIRE ANG ZIPWIRE 038X150 (WIRE) IMPLANT
GUIDEWIRE STR DUAL SENSOR (WIRE) ×4 IMPLANT
HOLDER FOLEY CATH W/STRAP (MISCELLANEOUS) ×2 IMPLANT
LOOP CUT BIPOLAR 24F LRG (ELECTROSURGICAL) ×2 IMPLANT
MANIFOLD NEPTUNE II (INSTRUMENTS) ×4 IMPLANT
NDL SAFETY ECLIPSE 18X1.5 (NEEDLE) IMPLANT
NEEDLE HYPO 18GX1.5 SHARP (NEEDLE) ×4
PACK CYSTO (CUSTOM PROCEDURE TRAY) ×4 IMPLANT
PAD TELFA 2X3 NADH STRL (GAUZE/BANDAGES/DRESSINGS) ×2 IMPLANT
PLUG CATH AND CAP STER (CATHETERS) ×2 IMPLANT
SET ASPIRATION TUBING (TUBING) IMPLANT
STENT URET 6FRX26 CONTOUR (STENTS) ×2 IMPLANT
SYRINGE IRR TOOMEY STRL 70CC (SYRINGE) IMPLANT
TUBING CONNECTING 10 (TUBING) ×3 IMPLANT
TUBING CONNECTING 10' (TUBING) ×1
WATER STERILE IRR 500ML POUR (IV SOLUTION) ×2 IMPLANT

## 2017-03-09 NOTE — Transfer of Care (Signed)
Immediate Anesthesia Transfer of Care Note  Patient: Curtis Patel  Procedure(s) Performed: Procedure(s): TRANSURETHRAL RESECTION OF BLADDER TUMOR (TURBT) (N/A) CYSTOSCOPY WITH BILATERAL RETROGRADE PYELOGRAM/ LEFT URETERAL STENT PLACEMENT (Bilateral)  Patient Location: PACU  Anesthesia Type:General  Level of Consciousness: awake, alert  and oriented  Airway & Oxygen Therapy: Patient Spontanous Breathing and Patient connected to face mask oxygen  Post-op Assessment: Report given to RN and Post -op Vital signs reviewed and stable  Post vital signs: Reviewed and stable  Last Vitals:  Vitals:   03/09/17 1356  BP: (!) 149/71  Pulse: 72  Resp: 16  Temp: 36.8 C  SpO2: 99%    Last Pain:  Vitals:   03/09/17 1356  TempSrc: Oral      Patients Stated Pain Goal: 4 (70/26/37 8588)  Complications: No apparent anesthesia complications

## 2017-03-09 NOTE — Discharge Instructions (Signed)
General Anesthesia, Adult, Care After These instructions provide you with information about caring for yourself after your procedure. Your health care provider may also give you more specific instructions. Your treatment has been planned according to current medical practices, but problems sometimes occur. Call your health care provider if you have any problems or questions after your procedure. What can I expect after the procedure? After the procedure, it is common to have:  Vomiting.  A sore throat.  Mental slowness.  It is common to feel:  Nauseous.  Cold or shivery.  Sleepy.  Tired.  Sore or achy, even in parts of your body where you did not have surgery.  Follow these instructions at home: For at least 24 hours after the procedure:  Do not: ? Participate in activities where you could fall or become injured. ? Drive. ? Use heavy machinery. ? Drink alcohol. ? Take sleeping pills or medicines that cause drowsiness. ? Make important decisions or sign legal documents. ? Take care of children on your own.  Rest. Eating and drinking  If you vomit, drink water, juice, or soup when you can drink without vomiting.  Drink enough fluid to keep your urine clear or pale yellow.  Make sure you have little or no nausea before eating solid foods.  Follow the diet recommended by your health care provider. General instructions  Have a responsible adult stay with you until you are awake and alert.  Return to your normal activities as told by your health care provider. Ask your health care provider what activities are safe for you.  Take over-the-counter and prescription medicines only as told by your health care provider.  If you smoke, do not smoke without supervision.  Keep all follow-up visits as told by your health care provider. This is important. Contact a health care provider if:  You continue to have nausea or vomiting at home, and medicines are not helpful.  You  cannot drink fluids or start eating again.  You cannot urinate after 8-12 hours.  You develop a skin rash.  You have fever.  You have increasing redness at the site of your procedure. Get help right away if:  You have difficulty breathing.  You have chest pain.  You have unexpected bleeding.  You feel that you are having a life-threatening or urgent problem. This information is not intended to replace advice given to you by your health care provider. Make sure you discuss any questions you have with your health care provider. Document Released: 09/20/2000 Document Revised: 11/17/2015 Document Reviewed: 05/29/2015 Elsevier Interactive Patient Education  2018 [place] may have urinary urgency (bladder spasms) and bloody urine on / off with stent and catheter in place. This is normal.  2 - Call MD or go to ER for fever >102, severe pain / nausea / vomiting not relieved by medications, or acute change in medical status     Indwelling Urinary Catheter Insertion, Care After This sheet gives you information about how to care for yourself after your procedure. Your health care provider may also give you more specific instructions. If you have problems or questions, contact your health care provider. What can I expect after the procedure? After the procedure, it is common to have:  Slight discomfort around your urethra where the catheter enters your body.  Follow these instructions at home:  Keep the drainage bag at or below the level of your bladder. Doing this ensures that urine can only drain out, not  back into your body.  Secure the catheter tubing and drainage bag to your leg or thigh to keep it from moving.  Check the catheter tubing regularly to make sure there are no kinks or blockages.  Take showers daily to keep the catheter clean. Do not take a bath.  Do not pull on your catheter or try to remove it.  Disconnect the tubing and drainage bag as little as  possible.  Empty the drainage bag every 2-4 hours, or more often if needed. Do not let the bag get completely full.  Wash your hands with soap and water before and after touching the catheter, tubing, or drainage bag.  Do not let the drainage bag or catheter tubing touch the floor.  Drink enough fluids to keep your urine clear or pale yellow, or as told by your health care provider. Contact a health care provider if:  Urine stops flowing into the drainage bag.  You feel pain or pressure in the bladder area.  You have back pain.  Your catheter gets clogged.  Your catheter starts to leak.  Your urine looks cloudy.  Your drainage bag or tubing looks dirty.  You notice a bad smell when emptying your drainage bag. Get help right away if:  You have a fever or chills.  You have severe pain in your back or your lower abdomen.  You have warmth, redness, swelling, or pain in the urethra area.  You notice blood in your urine.  Your catheter gets pulled out. Summary  Do not pull on your catheter or try to remove it.  Keep the drainage bag at or below the level of your bladder, but do not let the drainage bag or catheter tubing touch the floor.  Wash your hands with soap and water before and after touching the catheter, tubing, or drainage bag.  Contact your health care provider if you have a fever, chills, or any other signs of infection. This information is not intended to replace advice given to you by your health care provider. Make sure you discuss any questions you have with your health care provider. Document Released: 07/24/2016 Document Revised: 07/24/2016 Document Reviewed: 07/24/2016 Elsevier Interactive Patient Education  [employer].

## 2017-03-09 NOTE — Interval H&P Note (Signed)
History and Physical Interval Note:  03/09/2017 5:03 PM  Curtis Patel  has presented today for surgery, with the diagnosis of BLADDER CANCER  The various methods of treatment have been discussed with the patient and family. After consideration of risks, benefits and other options for treatment, the patient has consented to  Procedure(s): TRANSURETHRAL RESECTION OF BLADDER TUMOR (TURBT) (N/A) CYSTOSCOPY WITH BILATERAL RETROGRADE PYELOGRAM/ LEFT URETERAL STENT PLACEMENT (Bilateral) HOLMIUM LASER APPLICATION (N/A) as a surgical intervention .  The patient's history has been reviewed, patient examined, no change in status, stable for surgery.  I have reviewed the patient's chart and labs.  Questions were answered to the patient's satisfaction.     [name]

## 2017-03-09 NOTE — H&P (Signed)
Curtis Patel is an 76 y.o. male.    Chief Complaint: Pre-Op Transurethral Resection of Bladder Tumor  HPI:   1 - Microscopic Hematuria / Bladder Cancer - blood on UA noted by PCP x several 2018. No gross episodes. Non smoker. CT 12/2016 with some left bladder neck thickening / enhancement. Cysto 01/2017 confirms approx 4cm left wall tumro near left UO.    PMH sig for ortho surgery, cateract surgyer. NO ischemic CV disease / blood thinners. He is remarkably active and [place]. His PCP is Claretta Fraise MD.   Today " Curtis Patel " is seen to proceed with TURBT / retrogrades, likely left stent placement for new left wall / trigone bladder cancer. Most recent UA without infectious parameters.     Past Medical History:  Diagnosis Date  . GERD (gastroesophageal reflux disease)     Past Surgical History:  Procedure Laterality Date  . EYE SURGERY Right     No family history on file. Social History:  reports that he has quit smoking. He has never used smokeless tobacco. He reports that he does not drink alcohol or use drugs.  Allergies: No Known Allergies  No prescriptions prior to admission.    No results found for this or any previous visit (from the past 48 hour(s)). No results found.  Review of Systems  Constitutional: Negative.  Negative for chills and fever.  HENT: Negative.   Eyes: Negative.   Respiratory: Negative.   Cardiovascular: Negative.   Gastrointestinal: Negative.   Genitourinary: Negative.  Negative for flank pain.  Musculoskeletal: Negative.   Skin: Negative.   Neurological: Negative.   Endo/Heme/Allergies: Negative.   Psychiatric/Behavioral: Negative.     There were no vitals taken for this visit. Physical Exam  Constitutional: He appears well-developed.  HENT:  Head: Normocephalic.  Eyes: Pupils are equal, round, and reactive to light.  Neck: Normal range of motion.  Cardiovascular: Normal rate.   Respiratory:  Effort normal.  GI: Soft.  Genitourinary:  Genitourinary Comments: No CVAT.   Neurological: He is alert.  Skin: Skin is warm.  Psychiatric: He has a normal mood and affect.     Assessment/Plan  Proceed as planned with TURBT, retrogrades, likely left ureteral stent with diagnostic and theraputic intent for bladder cancer. Risks, benefits, alternatives, expected peri-op course discussed previously and reiterated today.   Alexis Frock, MD 03/09/2017, 7:36 AM

## 2017-03-09 NOTE — Anesthesia Preprocedure Evaluation (Signed)
Anesthesia Evaluation  Patient identified by MRN, date of birth, ID band Patient awake    Reviewed: Allergy & Precautions, NPO status , Patient's Chart, lab work & pertinent test results  Airway Mallampati: II  TM Distance: >3 FB Neck ROM: Full    Dental no notable dental hx.    Pulmonary former smoker,    Pulmonary exam normal breath sounds clear to auscultation       Cardiovascular negative cardio ROS Normal cardiovascular exam Rhythm:Regular Rate:Normal     Neuro/Psych negative neurological ROS  negative psych ROS   GI/Hepatic Neg liver ROS, GERD  ,  Endo/Other  negative endocrine ROS  Renal/GU negative Renal ROS     Musculoskeletal negative musculoskeletal ROS (+)   Abdominal   Peds  Hematology negative hematology ROS (+)   Anesthesia Other Findings   Reproductive/Obstetrics                             Anesthesia Physical Anesthesia Plan  ASA: II  Anesthesia Plan: General   Post-op Pain Management:    Induction: Intravenous  PONV Risk Score and Plan: 2 and Ondansetron and Dexamethasone  Airway Management Planned: LMA  Additional Equipment:   Intra-op Plan:   Post-operative Plan: Extubation in OR  Informed Consent: I have reviewed the patients History and Physical, chart, labs and discussed the procedure including the risks, benefits and alternatives for the proposed anesthesia with the patient or authorized representative who has indicated his/her understanding and acceptance.   Dental advisory given  Plan Discussed with: CRNA  Anesthesia Plan Comments:         Anesthesia Quick Evaluation

## 2017-03-09 NOTE — Brief Op Note (Signed)
03/09/2017  6:13 PM  PATIENT:  Curtis Patel  76 y.o. male  PRE-OPERATIVE DIAGNOSIS:  BLADDER CANCER  POST-OPERATIVE DIAGNOSIS:  BLADDER CANCER  PROCEDURE:  Procedure(s): TRANSURETHRAL RESECTION OF BLADDER TUMOR (TURBT) (N/A) CYSTOSCOPY WITH BILATERAL RETROGRADE PYELOGRAM/ LEFT URETERAL STENT PLACEMENT (Bilateral)  SURGEON:  Surgeon(s) and Role:    * Alexis Frock, MD - Primary  PHYSICIAN ASSISTANT:   ASSISTANTS: none   ANESTHESIA:   general  EBL:  Total I/O In: 1000 [I.V.:1000] Out: -   BLOOD ADMINISTERED:none  DRAINS: 3F foley to gravity   LOCAL MEDICATIONS USED:  NONE  SPECIMEN:  Source of Specimen:  bladder tumor, base of bladder tumor  DISPOSITION OF SPECIMEN:  PATHOLOGY  COUNTS:  YES  TOURNIQUET:  * No tourniquets in log *  DICTATION: .Other Dictation: Dictation Number [id number]  PLAN OF CARE: Discharge to home after PACU  PATIENT DISPOSITION:  PACU - hemodynamically stable.   Delay start of Pharmacological VTE agent (>24hrs) due to surgical blood loss or risk of bleeding: yes

## 2017-03-09 NOTE — Anesthesia Postprocedure Evaluation (Signed)
Anesthesia Post Note  Patient: Curtis Patel  Procedure(s) Performed: Procedure(s) (LRB): TRANSURETHRAL RESECTION OF BLADDER TUMOR (TURBT) (N/A) CYSTOSCOPY WITH BILATERAL RETROGRADE PYELOGRAM/ LEFT URETERAL STENT PLACEMENT (Bilateral)     Patient location during evaluation: PACU Anesthesia Type: General Level of consciousness: sedated and patient cooperative Pain management: pain level controlled Vital Signs Assessment: post-procedure vital signs reviewed and stable Respiratory status: spontaneous breathing Cardiovascular status: stable Anesthetic complications: no    Last Vitals:  Vitals:   03/09/17 1827 03/09/17 1845  BP: (!) 160/84 (!) 142/80  Pulse: (!) 56 61  Resp: 16 12  Temp:    SpO2: 100% 100%    Last Pain:  Vitals:   03/09/17 1356  TempSrc: Oral                 Nolon Nations

## 2017-03-09 NOTE — Anesthesia Procedure Notes (Signed)
Procedure Name: LMA Insertion Date/Time: 03/09/2017 5:19 PM Performed by: Noralyn Pick D Pre-anesthesia Checklist: Patient identified, Emergency Drugs available, Suction available and Patient being monitored Patient Re-evaluated:Patient Re-evaluated prior to induction Oxygen Delivery Method: Circle system utilized Preoxygenation: Pre-oxygenation with 100% oxygen Induction Type: IV induction Ventilation: Mask ventilation without difficulty LMA: LMA inserted LMA Size: 4.0 Tube type: Oral Number of attempts: 1 Placement Confirmation: positive ETCO2 and breath sounds checked- equal and bilateral Tube secured with: Tape Dental Injury: Teeth and Oropharynx as per pre-operative assessment

## 2017-03-10 ENCOUNTER — Encounter (HOSPITAL_COMMUNITY): Payer: Self-pay | Admitting: Urology

## 2017-03-10 NOTE — Op Note (Signed)
NAME:  NELSON, NOONE                    ACCOUNT NO.:  MEDICAL RECORD NO.:  [id number]  LOCATION:                                 FACILITY:  PHYSICIAN:  Alexis Frock, MD          DATE OF BIRTH:  DATE OF PROCEDURE: 03/09/2017                              OPERATIVE REPORT   DIAGNOSIS:  Large-volume bladder cancer.  PROCEDURES: 1. Transurethral resection of bladder tumor, volume large. 2. Bilateral retrograde pyelograms with interpretation. 3. Insertion of left ureteral stent, 6 x 26, Contour, no tether.  ESTIMATED BLOOD LOSS:  Nil.  COMPLICATION:  None.  SPECIMENS: 1. Bladder tumor. 2. Base of bladder tumor for permanent pathology.  FINDINGS: 1. Large volume papillary bladder tumor, query muscle invasive     involving left lateral trigone and wall, very close, but not     directly involving left ureteral orifice. 2. Unremarkable bilateral retrograde pyelograms. 3. Unremarkable left retrograde pyelogram following transurethral     resection of bladder tumor. 4. Successful placement of left ureteral stent, proximal in the renal     pelvis and distal in the bladder.  INDICATION:  Mr. Malizia is a very pleasant and very vigorous 76 year old gentleman without significant past medical history, who on workup of microscopic hematuria, was found to have a very large-volume bladder cancer, this was clinically localized.  This was very close to his left ureteral orifice without hydronephrosis.  Options were discussed for initial management including recommended past this operative transurethral resection for diagnostic and staging purposes with possible left ureteral stenting given the proximity of the tumor to his left ureteral orifice and he wished to proceed.  Informed consent was obtained and placed in the medical record.  PROCEDURE IN DETAIL:  The patient being, Italo Banton, was verified. Procedure being transurethral resection of bladder tumor, bilateral retrograde  pyelograms and left ureteral stent placement was confirmed. Procedure was carried out.  Time-out was performed.  Intravenous antibiotics were administered.  General LMA anesthesia induced.  The patient was placed into a low lithotomy position.  Sterile field was created by prepping and draping the patient's penis, perineum and proximal thighs using iodine.  Next, cystourethroscopy was performed using a 22-French rigid cystoscope with offset lens.  Inspection of the anterior and posterior unremarkable.  Inspection of the urinary bladder with a very large-volume papillary bladder tumor just lateral to left ureteral orifice and encompassing some of the left wall, total surface area approximately 7 cm2.  Photodocumentation performed.  Attention was directed to retrograde pyelogram.  The right ureteral orifice was cannulated with a 6-French end-hole catheter and right retrograde pyelogram was obtained.  Right retrograde pyelogram demonstrated a single right ureter with single-system right kidney.  No filling defects or narrowing noted. Similarly, left retrograde pyelogram was obtained.  Left retrograde pyelogram demonstrated a single left ureter with single- system left kidney.  No filling defects or narrowing noted.  The cystoscope was then exchanged for the 26-French resectoscope sheath with visual obturator and using resectoscope loop.  Very careful systematic resection was performed with bladder tumor down what appeared to be the superficial fibromuscular stroma of the bladder, taking exquisite  care to avoid injury to the left ureteral orifice, which did not occur grossly.  This bladder tumor was irrigated and set aside for permanent pathology, labeled as bladder tumor.  Next, cold cup biopsy forceps were used to obtain representative seromuscular deep bites, set aside as labeled base of bladder tumor.  The edges and base of this were carefully fulgurated, which resulted in excellent  hemostasis.  Given the proximity of the tumor and resection to the left ureteral orifice, it was felt that interval stenting would be warranted to prevent progression and malignant obstruction.  As I am concerned that this may be muscle invasive disease, as such, an additional left retrograde pyelogram was obtained, which revealed no extravasation along the left ureter and a 0.038 Sensor wire was advanced to the level of the upper pole, over which, a new 6 x 26 Contour-type stent was placed using cystoscopic and fluoroscopic guidance.  Given the large-volume tumor resection, it was felt that brief interval catheterization would be warranted.  As such, a new 24-French 3-way Foley catheter was placed per urethra to straight drain, 10 mL of sterile water in the balloon.  The irrigation port was plugged.  Efflux was very light pink and procedure was terminated.  The patient tolerated the procedure well.  There were no immediate periprocedural complications.  The patient was taken to the postanesthesia care unit in stable condition.          ______________________________ Alexis Frock, MD     TM/MEDQ  D:  03/09/2017  T:  03/10/2017  Job:  [id number]

## 2017-03-11 ENCOUNTER — Encounter: Payer: Self-pay | Admitting: Family Medicine

## 2017-03-11 DIAGNOSIS — C679 Malignant neoplasm of bladder, unspecified: Secondary | ICD-10-CM

## 2017-03-11 HISTORY — DX: Malignant neoplasm of bladder, unspecified: C67.9

## 2017-03-14 DIAGNOSIS — C676 Malignant neoplasm of ureteric orifice: Secondary | ICD-10-CM | POA: Diagnosis not present

## 2017-03-14 DIAGNOSIS — R310 Gross hematuria: Secondary | ICD-10-CM | POA: Diagnosis not present

## 2017-03-28 DIAGNOSIS — R3121 Asymptomatic microscopic hematuria: Secondary | ICD-10-CM | POA: Diagnosis not present

## 2017-03-28 DIAGNOSIS — C670 Malignant neoplasm of trigone of bladder: Secondary | ICD-10-CM | POA: Diagnosis not present

## 2017-03-28 DIAGNOSIS — R300 Dysuria: Secondary | ICD-10-CM | POA: Diagnosis not present

## 2017-04-01 ENCOUNTER — Other Ambulatory Visit: Payer: Self-pay | Admitting: Urology

## 2017-04-18 ENCOUNTER — Encounter (HOSPITAL_BASED_OUTPATIENT_CLINIC_OR_DEPARTMENT_OTHER): Payer: Self-pay | Admitting: *Deleted

## 2017-04-19 ENCOUNTER — Encounter (HOSPITAL_BASED_OUTPATIENT_CLINIC_OR_DEPARTMENT_OTHER): Payer: Self-pay | Admitting: *Deleted

## 2017-04-19 NOTE — Progress Notes (Signed)
NPO AFTER MN.  ARRIVE AT 0800.  NEEDS ISTAT 8.

## 2017-04-25 DIAGNOSIS — N3000 Acute cystitis without hematuria: Secondary | ICD-10-CM | POA: Diagnosis not present

## 2017-04-25 DIAGNOSIS — C670 Malignant neoplasm of trigone of bladder: Secondary | ICD-10-CM | POA: Diagnosis not present

## 2017-04-25 DIAGNOSIS — R300 Dysuria: Secondary | ICD-10-CM | POA: Diagnosis not present

## 2017-04-27 ENCOUNTER — Encounter (HOSPITAL_BASED_OUTPATIENT_CLINIC_OR_DEPARTMENT_OTHER)
Admission: RE | Disposition: A | Discharge status: Home / Self Care | Payer: Self-pay | Source: Ambulatory Visit | Attending: Urology

## 2017-04-27 ENCOUNTER — Ambulatory Visit (HOSPITAL_BASED_OUTPATIENT_CLINIC_OR_DEPARTMENT_OTHER): Payer: Medicare Other | Admitting: Anesthesiology

## 2017-04-27 ENCOUNTER — Encounter (HOSPITAL_BASED_OUTPATIENT_CLINIC_OR_DEPARTMENT_OTHER): Payer: Self-pay

## 2017-04-27 ENCOUNTER — Ambulatory Visit (HOSPITAL_BASED_OUTPATIENT_CLINIC_OR_DEPARTMENT_OTHER)
Admission: RE | Admit: 2017-04-27 | Discharge: 2017-04-27 | Disposition: A | Discharge status: Home / Self Care | Payer: Medicare Other | Source: Ambulatory Visit | Attending: Urology | Admitting: Urology

## 2017-04-27 DIAGNOSIS — C677 Malignant neoplasm of urachus: Secondary | ICD-10-CM | POA: Diagnosis not present

## 2017-04-27 DIAGNOSIS — Z87891 Personal history of nicotine dependence: Secondary | ICD-10-CM | POA: Diagnosis not present

## 2017-04-27 DIAGNOSIS — N3010 Interstitial cystitis (chronic) without hematuria: Secondary | ICD-10-CM | POA: Diagnosis not present

## 2017-04-27 DIAGNOSIS — C679 Malignant neoplasm of bladder, unspecified: Secondary | ICD-10-CM | POA: Diagnosis not present

## 2017-04-27 DIAGNOSIS — Z85828 Personal history of other malignant neoplasm of skin: Secondary | ICD-10-CM | POA: Insufficient documentation

## 2017-04-27 DIAGNOSIS — Z8551 Personal history of malignant neoplasm of bladder: Secondary | ICD-10-CM | POA: Diagnosis not present

## 2017-04-27 DIAGNOSIS — C678 Malignant neoplasm of overlapping sites of bladder: Secondary | ICD-10-CM | POA: Diagnosis not present

## 2017-04-27 HISTORY — DX: Personal history of other medical treatment: Z92.89

## 2017-04-27 HISTORY — DX: Presence of dental prosthetic device (complete) (partial): Z97.2

## 2017-04-27 HISTORY — PX: TRANSURETHRAL RESECTION OF BLADDER TUMOR: SHX2575

## 2017-04-27 HISTORY — DX: Nocturia: R35.1

## 2017-04-27 HISTORY — DX: Frequency of micturition: R35.0

## 2017-04-27 HISTORY — PX: CYSTOSCOPY W/ URETERAL STENT PLACEMENT: SHX1429

## 2017-04-27 HISTORY — DX: Personal history of other malignant neoplasm of skin: Z85.828

## 2017-04-27 LAB — POCT I-STAT, CHEM 8
BUN: 25 mg/dL — AB (ref 6–20)
CALCIUM ION: 1.14 mmol/L — AB (ref 1.15–1.40)
CHLORIDE: 98 mmol/L — AB (ref 101–111)
Creatinine, Ser: 1.1 mg/dL (ref 0.61–1.24)
Glucose, Bld: 108 mg/dL — ABNORMAL HIGH (ref 65–99)
HCT: 39 % (ref 39.0–52.0)
Hemoglobin: 13.3 g/dL (ref 13.0–17.0)
Potassium: 3 mmol/L — ABNORMAL LOW (ref 3.5–5.1)
SODIUM: 136 mmol/L (ref 135–145)
TCO2: 24 mmol/L (ref 22–32)

## 2017-04-27 SURGERY — TURBT (TRANSURETHRAL RESECTION OF BLADDER TUMOR)
Anesthesia: Monitor Anesthesia Care

## 2017-04-27 MED ORDER — DEXAMETHASONE SODIUM PHOSPHATE 10 MG/ML IJ SOLN
INTRAMUSCULAR | Status: AC
  Filled 2017-04-27: qty 1

## 2017-04-27 MED ORDER — PHENYLEPHRINE 40 MCG/ML (10ML) SYRINGE FOR IV PUSH (FOR BLOOD PRESSURE SUPPORT)
PREFILLED_SYRINGE | INTRAVENOUS | Status: AC
  Filled 2017-04-27: qty 10

## 2017-04-27 MED ORDER — ONDANSETRON HCL 4 MG/2ML IJ SOLN
INTRAMUSCULAR | Status: DC | PRN
  Administered 2017-04-27: 4 mg via INTRAVENOUS

## 2017-04-27 MED ORDER — LACTATED RINGERS IV SOLN
INTRAVENOUS | Status: DC
  Administered 2017-04-27 (×2): via INTRAVENOUS
  Filled 2017-04-27: qty 1000

## 2017-04-27 MED ORDER — TRAMADOL HCL 50 MG PO TABS: 50.0000 mg | ORAL_TABLET | Freq: Four times a day (QID) | ORAL | 0 refills | Status: AC | PRN

## 2017-04-27 MED ORDER — FENTANYL CITRATE (PF) 100 MCG/2ML IJ SOLN
INTRAMUSCULAR | Status: DC | PRN
  Administered 2017-04-27 (×4): 25 ug via INTRAVENOUS

## 2017-04-27 MED ORDER — LIDOCAINE 2% (20 MG/ML) 5 ML SYRINGE
INTRAMUSCULAR | Status: AC
  Filled 2017-04-27: qty 5

## 2017-04-27 MED ORDER — FENTANYL CITRATE (PF) 100 MCG/2ML IJ SOLN
25.0000 ug | INTRAMUSCULAR | Status: DC | PRN
  Filled 2017-04-27: qty 1

## 2017-04-27 MED ORDER — FENTANYL CITRATE (PF) 100 MCG/2ML IJ SOLN
INTRAMUSCULAR | Status: AC
  Filled 2017-04-27: qty 2

## 2017-04-27 MED ORDER — DEXAMETHASONE SODIUM PHOSPHATE 10 MG/ML IJ SOLN
INTRAMUSCULAR | Status: DC | PRN
  Administered 2017-04-27: 10 mg via INTRAVENOUS

## 2017-04-27 MED ORDER — PROPOFOL 10 MG/ML IV BOLUS
INTRAVENOUS | Status: DC | PRN
  Administered 2017-04-27: 30 mg via INTRAVENOUS
  Administered 2017-04-27: 100 mg via INTRAVENOUS

## 2017-04-27 MED ORDER — GENTAMICIN SULFATE 40 MG/ML IJ SOLN
5.0000 mg/kg | INTRAVENOUS | Status: AC
  Administered 2017-04-27: 300 mg via INTRAVENOUS
  Filled 2017-04-27 (×2): qty 7.5

## 2017-04-27 MED ORDER — PROPOFOL 10 MG/ML IV BOLUS
INTRAVENOUS | Status: AC
  Filled 2017-04-27: qty 20

## 2017-04-27 MED ORDER — LIDOCAINE 2% (20 MG/ML) 5 ML SYRINGE
INTRAMUSCULAR | Status: DC | PRN
  Administered 2017-04-27: 60 mg via INTRAVENOUS

## 2017-04-27 MED ORDER — PHENYLEPHRINE 40 MCG/ML (10ML) SYRINGE FOR IV PUSH (FOR BLOOD PRESSURE SUPPORT)
PREFILLED_SYRINGE | INTRAVENOUS | Status: DC | PRN
  Administered 2017-04-27: 40 ug via INTRAVENOUS
  Administered 2017-04-27: 80 ug via INTRAVENOUS
  Administered 2017-04-27: 40 ug via INTRAVENOUS
  Administered 2017-04-27 (×2): 80 ug via INTRAVENOUS

## 2017-04-27 MED ORDER — ONDANSETRON HCL 4 MG/2ML IJ SOLN
INTRAMUSCULAR | Status: AC
  Filled 2017-04-27: qty 2

## 2017-04-27 SURGICAL SUPPLY — 37 items
BAG DRAIN URO-CYSTO SKYTR STRL (DRAIN) ×4 IMPLANT
BAG DRN ANRFLXCHMBR STRAP LEK (BAG)
BAG DRN UROCATH (DRAIN) ×2
BAG URINE DRAINAGE (UROLOGICAL SUPPLIES) IMPLANT
BAG URINE LEG 19OZ MD ST LTX (BAG) IMPLANT
BAG URINE LEG 500ML (DRAIN) IMPLANT
BASKET LASER NITINOL 1.9FR (BASKET) IMPLANT
BSKT STON RTRVL 120 1.9FR (BASKET)
CATH FOLEY 2WAY SLVR  5CC 22FR (CATHETERS)
CATH FOLEY 2WAY SLVR 30CC 20FR (CATHETERS) IMPLANT
CATH FOLEY 2WAY SLVR 5CC 22FR (CATHETERS) IMPLANT
CATH INTERMIT  6FR 70CM (CATHETERS) ×2 IMPLANT
CLOTH BEACON ORANGE TIMEOUT ST (SAFETY) ×4 IMPLANT
ELECT REM PT RETURN 9FT ADLT (ELECTROSURGICAL) ×4
ELECTRODE REM PT RTRN 9FT ADLT (ELECTROSURGICAL) ×2 IMPLANT
EVACUATOR MICROVAS BLADDER (UROLOGICAL SUPPLIES) IMPLANT
FIBER LASER FLEXIVA 365 (UROLOGICAL SUPPLIES) IMPLANT
FIBER LASER TRAC TIP (UROLOGICAL SUPPLIES) IMPLANT
GLOVE BIO SURGEON STRL SZ7.5 (GLOVE) ×4 IMPLANT
GOWN STRL REUS W/ TWL LRG LVL3 (GOWN DISPOSABLE) ×2 IMPLANT
GOWN STRL REUS W/TWL LRG LVL3 (GOWN DISPOSABLE) ×8 IMPLANT
GUIDEWIRE ANG ZIPWIRE 038X150 (WIRE) ×4 IMPLANT
GUIDEWIRE STR DUAL SENSOR (WIRE) ×4 IMPLANT
INFUSOR MANOMETER BAG 3000ML (MISCELLANEOUS) ×4 IMPLANT
IV NS 1000ML (IV SOLUTION) ×4
IV NS 1000ML BAXH (IV SOLUTION) ×2 IMPLANT
IV NS IRRIG 3000ML ARTHROMATIC (IV SOLUTION) ×4 IMPLANT
KIT RM TURNOVER CYSTO AR (KITS) ×4 IMPLANT
LOOP CUT BIPOLAR 24F LRG (ELECTROSURGICAL) ×2 IMPLANT
MANIFOLD NEPTUNE II (INSTRUMENTS) ×4 IMPLANT
NS IRRIG 500ML POUR BTL (IV SOLUTION) ×8 IMPLANT
PACK CYSTO (CUSTOM PROCEDURE TRAY) ×4 IMPLANT
SYRINGE 10CC LL (SYRINGE) ×6 IMPLANT
SYRINGE IRR TOOMEY STRL 70CC (SYRINGE) IMPLANT
TUBE CONNECTING 12'X1/4 (SUCTIONS) ×1
TUBE CONNECTING 12X1/4 (SUCTIONS) ×1 IMPLANT
TUBE FEEDING 8FR 16IN STR KANG (MISCELLANEOUS) IMPLANT

## 2017-04-27 NOTE — Transfer of Care (Signed)
Immediate Anesthesia Transfer of Care Note  Patient: Curtis Patel  Procedure(s) Performed: TRANSURETHRAL RESECTION OF BLADDER TUMOR (TURBT) (N/A ) CYSTOSCOPY WITH RETROGRADE PYELOGRAM/URETERAL LEFT STENT REPLACEMENT (Bilateral )  Patient Location: PACU  Anesthesia Type:General  Level of Consciousness: awake, alert  and oriented  Airway & Oxygen Therapy: Patient Spontanous Breathing and Patient connected to nasal cannula oxygen  Post-op Assessment: Report given to RN and Post -op Vital signs reviewed and stable  Post vital signs: Reviewed and stable  Last Vitals: 107/68, 63, 20, 99%, 98.3 Vitals:   04/27/17 0752  BP: 103/64  Pulse: 71  Resp: 16  Temp: 36.4 C  SpO2: 100%    Last Pain:  Vitals:   04/27/17 0752  TempSrc: Oral      Patients Stated Pain Goal: 3 (44/62/86 3817)  Complications: No apparent anesthesia complications

## 2017-04-27 NOTE — Op Note (Signed)
NAME:  Curtis Patel, Curtis Patel                    ACCOUNT NO.:  MEDICAL RECORD NO.:  [id number]  LOCATION:                                 FACILITY:  PHYSICIAN:  Alexis Frock, MD     DATE OF BIRTH:  1941/03/08  DATE OF PROCEDURE: 04/27/2017                              OPERATIVE REPORT   DIAGNOSIS:  History of high-grade superficial bladder cancer.  PROCEDURE: 1. Cystoscopy with left retrograde pyelogram with interpretation. 2. Removal of left ureteral stent. 3. Transurethral resection of bladder tumor, volume medium.  ESTIMATED BLOOD LOSS:  Nil.  COMPLICATIONS:  None.  SPECIMEN: 1. Old resection site for permanent pathology. 2. Base of old resection site for permanent pathology.  FINDINGS: 1. No obvious recurrent residual tumor, only erythema at old resection     site. 2. No evidence of bladder perforation following transurethral     resection of old resection site approximately 4 sq cm. 3. Unremarkable left retrograde pyelogram. 4. Successful removal of left ureteral stent, area of resection today     at least 2 cm away from ureteral orifice.  It was not felt that     interval stenting would be warranted.  INDICATION:  Curtis Patel is a very pleasant and quite vigorous 76 year old gentleman, who was found on workup of hematuria to have large volume bladder cancer.  He underwent transurethral resection approximately 6 weeks ago, which revealed a large volume high grade, but superficial bladder cancer.  Notably, there was not muscle identified in the specimen at that time.  This was quite close to his left ureteral orifice.  He underwent left ureteral stenting for protection of this at the same time.  Given previous findings, options were discussed for further management including proceeding with induction BCG versus recommended path of restaging resection and he presents for this today. Informed consent was obtained and placed in the medical record.  PROCEDURE IN DETAIL:  The  patient being, Curtis Patel, was verified. Procedure being, cysto, left retrograde, possible left exchange versus removal, and restaging transurethral resection, were confirmed. Procedure was carried out.  Time-out was performed.  Intravenous antibiotics were administered.  General LMA anesthesia introduced.  The patient was placed into a low lithotomy position.  Sterile field was created by prepping and draping the patient's penis, perineum, proximal thighs using iodine.  Cystourethroscopy was performed using a 22-French rigid cystoscope with offset lens after urethral calibration to 28- [place] using sounds.  Inspection of the anterior and posterior urethra revealed only a prior prostatic urethral biopsy site that was unremarkable.  Inspection of urinary bladder revealed a distal end of left ureteral stent in situ.  There was erythema overlying the area lateral to this consistent with old resection site.  There was no obvious gross residual recurrent papillary tumor.  The right ureteral orifice was unremarkable.  The cystoscope was then exchanged for a 26- [place] continuous flow resectoscope sheath with visual obturator and systematic inspection was performed of the old resection site down to what appeared to be the superficial fibromuscular stroma of the urinary bladder.  This was approximately 4 sq cm.  These tissue fragments were irrigated and set aside for  permanent pathology, labeled as old resection site.  Using cystoscope and small cold cup graspers, representative deep muscular bites were taken x2 to obtain muscle specimen.  These were labeled as base of old resection site.  Exquisite care was taken to avoid bladder perforation, which did not occur.  Coagulation current was applied using resectoscope loop to the entire area of resection and the edges.  Notably resection today was well away from the left ureteral orifice by approximately 2 cm or so.  As such, the end of left  stent was grasped at proximal level of the urethral meatus with a 0.038 Zip wire was advanced to the level of the upper pole, exchanged for an open-ended catheter and left retrograde pyelogram was obtained.  Left retrograde pyelogram demonstrated a single left ureter with single- system left kidney.  No filling defects or narrowing noted.  As such, the open-ended catheter was removed, thus completely removing all left- sided ureteral hardware.  Bladder was left partially full.  Procedure was terminated.  The patient tolerated procedure well.  There were no immediate periprocedural complications.  The patient was taken to the postanesthesia care unit in stable condition.          ______________________________ Alexis Frock, MD     TM/MEDQ  D:  04/27/2017  T:  04/27/2017  Job:  [id number]

## 2017-04-27 NOTE — Anesthesia Postprocedure Evaluation (Signed)
Anesthesia Post Note  Patient: Curtis Patel  Procedure(s) Performed: TRANSURETHRAL RESECTION OF BLADDER TUMOR (TURBT) (N/A ) CYSTOSCOPY WITH RETROGRADE PYELOGRAM/URETERAL LEFT STENT REPLACEMENT (Bilateral )     Patient location during evaluation: PACU Anesthesia Type: General Level of consciousness: awake Pain management: pain level controlled Vital Signs Assessment: post-procedure vital signs reviewed and stable Respiratory status: spontaneous breathing Cardiovascular status: stable Anesthetic complications: no    Last Vitals:  Vitals:   04/27/17 1045 04/27/17 1109  BP: (!) 101/59 112/61  Pulse: (!) 59 (!) 57  Resp: 18 16  Temp:  36.6 C  SpO2: 99% 100%    Last Pain:  Vitals:   04/27/17 0752  TempSrc: Oral                 Denzal Meir

## 2017-04-27 NOTE — Anesthesia Procedure Notes (Signed)
Procedure Name: LMA Insertion Date/Time: 04/27/2017 9:27 AM Performed by: Bethena Roys T Pre-anesthesia Checklist: Patient identified, Emergency Drugs available, Suction available and Patient being monitored Patient Re-evaluated:Patient Re-evaluated prior to induction Oxygen Delivery Method: Circle system utilized Preoxygenation: Pre-oxygenation with 100% oxygen Induction Type: IV induction Ventilation: Mask ventilation without difficulty LMA: LMA inserted LMA Size: 4.0 Number of attempts: 1 Airway Equipment and Method: Bite block Placement Confirmation: positive ETCO2 Tube secured with: Tape Dental Injury: Teeth and Oropharynx as per pre-operative assessment

## 2017-04-27 NOTE — Anesthesia Preprocedure Evaluation (Addendum)
Anesthesia Evaluation  Patient identified by MRN, date of birth, ID band Patient awake    Reviewed: Allergy & Precautions, NPO status , Patient's Chart, lab work & pertinent test results  Airway Mallampati: II  TM Distance: >3 FB     Dental   Pulmonary neg pulmonary ROS, former smoker,    breath sounds clear to auscultation       Cardiovascular negative cardio ROS   Rhythm:Regular Rate:Normal     Neuro/Psych    GI/Hepatic negative GI ROS, Neg liver ROS,   Endo/Other  negative endocrine ROS  Renal/GU negative Renal ROS     Musculoskeletal   Abdominal   Peds  Hematology   Anesthesia Other Findings   Reproductive/Obstetrics                            Anesthesia Physical Anesthesia Plan  ASA: II  Anesthesia Plan: MAC   Post-op Pain Management:    Induction: Intravenous  PONV Risk Score and Plan: 2 and Ondansetron, Dexamethasone and Treatment may vary due to age or medical condition  Airway Management Planned: Simple Face Mask  Additional Equipment:   Intra-op Plan:   Post-operative Plan:   Informed Consent:   Dental advisory given  Plan Discussed with: Anesthesiologist and CRNA  Anesthesia Plan Comments:         Anesthesia Quick Evaluation

## 2017-04-27 NOTE — H&P (Signed)
Curtis Patel is an 75 y.o. male.    Chief Complaint: Pre-op Restaging Transurethral Resection of Bladder Tumor / LEFT retrograde / stent exchange  HPI:   1 - Bladder Cancer - T1G3 bladder cancer by TURBT 02/2017. No muscle in specimen. Tumor very close to but not involving left UO, stented as well. Non smoker. CT 12/2016 with some left bladder neck thickening / enhancement.    PMH sig for ortho surgery, cateract surgyer. NO ischemic CV disease / blood thinners. He is remarkably active and [place]. His PCP is Claretta Fraise MD.   Today " Curtis Patel " is seen to proceed with restaging TURBT and Left retrograde / stent exchange. He did have interval NP visit with some non-specific malaise and mild bacteruria for which he has been on Keflex pre-op. Most recent final UCX non-clonal, UCX 10/29 pending. NO fevers x 24 hours.    Past Medical History:  Diagnosis Date  . Bladder cancer ([place]) 03/11/2017  . Frequency of urination   . History of basal cell carcinoma (BCC) of skin    09-15-2010  left face  . History of exercise stress test 08/26/2016   Normal echo stress w/ normal LVSF  . History of malignant neoplasm of skin    09-15-2010   s/p excision spindle cell neoplasm right nose  . Nocturia   . Wears dentures    upper    Past Surgical History:  Procedure Laterality Date  . CATARACT EXTRACTION Left 2000   per pt no lens implant  . CYSTOSCOPY W/ URETERAL STENT PLACEMENT Bilateral 03/09/2017   Procedure: CYSTOSCOPY WITH BILATERAL RETROGRADE PYELOGRAM/ LEFT URETERAL STENT PLACEMENT;  Surgeon: Alexis Frock, MD;  Location: [facility];  Service: Urology;  Laterality: Bilateral;  . DUPUYTREN CONTRACTURE RELEASE Left 09-22-2010   dr Fredna Dow [facility]   left ring and small fingers  . TRANSURETHRAL RESECTION OF BLADDER TUMOR N/A 03/09/2017   Procedure: TRANSURETHRAL RESECTION OF BLADDER TUMOR (TURBT);  Surgeon: Alexis Frock, MD;  Location: [facility];  Service: Urology;   Laterality: N/A;    History reviewed. No pertinent family history. Social History:  reports that he quit smoking about 54 years ago. His smoking use included Cigarettes. He quit after 5.00 years of use. He has never used smokeless tobacco. He reports that he does not drink alcohol or use drugs.  Allergies: No Known Allergies  No prescriptions prior to admission.    No results found for this or any previous visit (from the past 48 hour(s)). No results found.  Review of Systems  Constitutional: Positive for malaise/fatigue.  HENT: Negative.   Eyes: Negative.   Respiratory: Negative.   Cardiovascular: Negative.   Gastrointestinal: Negative.   Genitourinary: Negative.   Musculoskeletal: Negative.   Skin: Negative.   Neurological: Negative.   Endo/Heme/Allergies: Negative.   Psychiatric/Behavioral: Negative.     Height 5\' 7"  (1.702 m), weight 58.1 kg (128 lb). Physical Exam  Constitutional: He appears well-developed.  HENT:  Head: Normocephalic.  Eyes: Pupils are equal, round, and reactive to light.  Neck: Normal range of motion.  Cardiovascular: Normal rate.   Respiratory: Effort normal.  GI: Soft.  Genitourinary:  Genitourinary Comments: NO CVAT  Musculoskeletal: Normal range of motion.  Skin: Skin is warm.  Psychiatric: He has a normal mood and affect.     Assessment/Plan  Proceed as planned with restaging TURBT / left retrograde / stent exchange to r/o residual or rapidly recurrent bladder cancer. Risks, benefits, alternatives, expected  peri-op course discussed previously and reiterated today.   Alexis Frock, MD 04/27/2017, 6:43 AM

## 2017-04-27 NOTE — Discharge Instructions (Signed)
°  Post Anesthesia Home Care Instructions  Activity: Get plenty of rest for the remainder of the day. A responsible individual must stay with you for 24 hours following the procedure.  For the next 24 hours, DO NOT: -Drive a car -Paediatric nurse -Drink alcoholic beverages -Take any medication unless instructed by your physician -Make any legal decisions or sign important papers.  Meals: Start with liquid foods such as gelatin or soup. Progress to regular foods as tolerated. Avoid greasy, spicy, heavy foods. If nausea and/or vomiting occur, drink only clear liquids until the nausea and/or vomiting subsides. Call your physician if vomiting continues.  Special Instructions/Symptoms: Your throat may feel dry or sore from the anesthesia or the breathing tube placed in your throat during surgery. If this causes discomfort, gargle with warm salt water. The discomfort should disappear within 24 hours.  If you had a scopolamine patch placed behind your ear for the management of post- operative nausea and/or vomiting:  1. The medication in the patch is effective for 72 hours, after which it should be removed.  Wrap patch in a tissue and discard in the trash. Wash hands thoroughly with soap and water. 2. You may remove the patch earlier than 72 hours if you experience unpleasant side effects which may include dry mouth, dizziness or visual disturbances. 3. Avoid touching the patch. Wash your hands with soap and water after contact with the patch.   1 - You may have urinary urgency (bladder spasms) and bloody urine on / off for up to 2 weeks. This is normal.  2 - Call MD or go to ER for fever >102, severe pain / nausea / vomiting not relieved by medications, or acute change in medical status

## 2017-04-27 NOTE — Brief Op Note (Signed)
04/27/2017  10:06 AM  PATIENT:  Curtis Patel  76 y.o. male  PRE-OPERATIVE DIAGNOSIS:  BLADDER CANCER  POST-OPERATIVE DIAGNOSIS:  BLADDER CANCER  PROCEDURE:  Procedure(s): TRANSURETHRAL RESECTION OF BLADDER TUMOR (TURBT) (N/A) CYSTOSCOPY WITH RETROGRADE PYELOGRAM/URETERAL LEFT STENT REPLACEMENT (Bilateral)  SURGEON:  Surgeon(s) and Role:    * Alexis Frock, MD - Primary  PHYSICIAN ASSISTANT:   ASSISTANTS: none   ANESTHESIA:   general  EBL:  minimal   BLOOD ADMINISTERED:none  DRAINS: none   LOCAL MEDICATIONS USED:  NONE  SPECIMEN:  Source of Specimen:  1 - bladder tumor; 2 - base of bladder tumor  DISPOSITION OF SPECIMEN:  PATHOLOGY  COUNTS:  YES  TOURNIQUET:  * No tourniquets in log *  DICTATION: .Other Dictation: Dictation Number [id number]  PLAN OF CARE: Discharge to home after PACU  PATIENT DISPOSITION:  PACU - hemodynamically stable.   Delay start of Pharmacological VTE agent (>24hrs) due to surgical blood loss or risk of bleeding: yes

## 2017-04-28 ENCOUNTER — Encounter (HOSPITAL_BASED_OUTPATIENT_CLINIC_OR_DEPARTMENT_OTHER): Payer: Self-pay | Admitting: Urology

## 2017-04-28 NOTE — Anesthesia Postprocedure Evaluation (Signed)
Anesthesia Post Note  Patient: Curtis Patel  Procedure(s) Performed: TRANSURETHRAL RESECTION OF BLADDER TUMOR (TURBT) (N/A ) CYSTOSCOPY WITH RETROGRADE PYELOGRAM/URETERAL LEFT STENT REPLACEMENT (Bilateral )     Patient location during evaluation: PACU Anesthesia Type: General Level of consciousness: awake Pain management: pain level controlled Vital Signs Assessment: post-procedure vital signs reviewed and stable Respiratory status: spontaneous breathing Cardiovascular status: stable Anesthetic complications: no    Last Vitals:  Vitals:   04/27/17 1045 04/27/17 1109  BP: (!) 101/59 112/61  Pulse: (!) 59 (!) 57  Resp: 18 16  Temp:  36.6 C  SpO2: 99% 100%    Last Pain:  Vitals:   04/27/17 0752  TempSrc: Oral   Pain Goal: Patients Stated Pain Goal: 3 (04/27/17 0849)               Locklyn Henriquez

## 2017-05-24 DIAGNOSIS — H5201 Hypermetropia, right eye: Secondary | ICD-10-CM | POA: Diagnosis not present

## 2017-05-24 DIAGNOSIS — C670 Malignant neoplasm of trigone of bladder: Secondary | ICD-10-CM | POA: Diagnosis not present

## 2017-05-24 DIAGNOSIS — H2513 Age-related nuclear cataract, bilateral: Secondary | ICD-10-CM | POA: Diagnosis not present

## 2017-05-31 DIAGNOSIS — C670 Malignant neoplasm of trigone of bladder: Secondary | ICD-10-CM | POA: Diagnosis not present

## 2017-05-31 DIAGNOSIS — Z5111 Encounter for antineoplastic chemotherapy: Secondary | ICD-10-CM | POA: Diagnosis not present

## 2017-06-07 DIAGNOSIS — Z5111 Encounter for antineoplastic chemotherapy: Secondary | ICD-10-CM | POA: Diagnosis not present

## 2017-06-07 DIAGNOSIS — C670 Malignant neoplasm of trigone of bladder: Secondary | ICD-10-CM | POA: Diagnosis not present

## 2017-06-14 DIAGNOSIS — C670 Malignant neoplasm of trigone of bladder: Secondary | ICD-10-CM | POA: Diagnosis not present

## 2017-06-14 DIAGNOSIS — Z5111 Encounter for antineoplastic chemotherapy: Secondary | ICD-10-CM | POA: Diagnosis not present

## 2017-06-22 DIAGNOSIS — Z5111 Encounter for antineoplastic chemotherapy: Secondary | ICD-10-CM | POA: Diagnosis not present

## 2017-06-22 DIAGNOSIS — C670 Malignant neoplasm of trigone of bladder: Secondary | ICD-10-CM | POA: Diagnosis not present

## 2017-06-29 DIAGNOSIS — C670 Malignant neoplasm of trigone of bladder: Secondary | ICD-10-CM | POA: Diagnosis not present

## 2017-06-29 DIAGNOSIS — Z5111 Encounter for antineoplastic chemotherapy: Secondary | ICD-10-CM | POA: Diagnosis not present

## 2017-08-08 DIAGNOSIS — C670 Malignant neoplasm of trigone of bladder: Secondary | ICD-10-CM | POA: Diagnosis not present

## 2017-08-08 DIAGNOSIS — R3121 Asymptomatic microscopic hematuria: Secondary | ICD-10-CM | POA: Diagnosis not present

## 2017-11-07 DIAGNOSIS — N3000 Acute cystitis without hematuria: Secondary | ICD-10-CM | POA: Diagnosis not present

## 2017-11-07 DIAGNOSIS — C670 Malignant neoplasm of trigone of bladder: Secondary | ICD-10-CM | POA: Diagnosis not present

## 2018-01-26 ENCOUNTER — Other Ambulatory Visit: Payer: Self-pay

## 2018-02-07 ENCOUNTER — Telehealth: Payer: Self-pay | Admitting: Family Medicine

## 2018-02-14 DIAGNOSIS — C670 Malignant neoplasm of trigone of bladder: Secondary | ICD-10-CM | POA: Diagnosis not present

## 2018-04-07 DIAGNOSIS — Z23 Encounter for immunization: Secondary | ICD-10-CM | POA: Diagnosis not present

## 2018-05-15 ENCOUNTER — Other Ambulatory Visit: Payer: Self-pay

## 2018-05-16 DIAGNOSIS — C670 Malignant neoplasm of trigone of bladder: Secondary | ICD-10-CM | POA: Diagnosis not present

## 2018-08-15 DIAGNOSIS — C670 Malignant neoplasm of trigone of bladder: Secondary | ICD-10-CM | POA: Diagnosis not present

## 2018-11-14 DIAGNOSIS — C670 Malignant neoplasm of trigone of bladder: Secondary | ICD-10-CM | POA: Diagnosis not present

## 2018-12-02 IMAGING — DX DG CHEST 2V
2 series · 2 of 2 positions shown · non-contrast
Comparison: None in PACs

CLINICAL DATA: Shortness of breath, routine physical examination.

EXAM:
CHEST  2 VIEW

[chest pa]
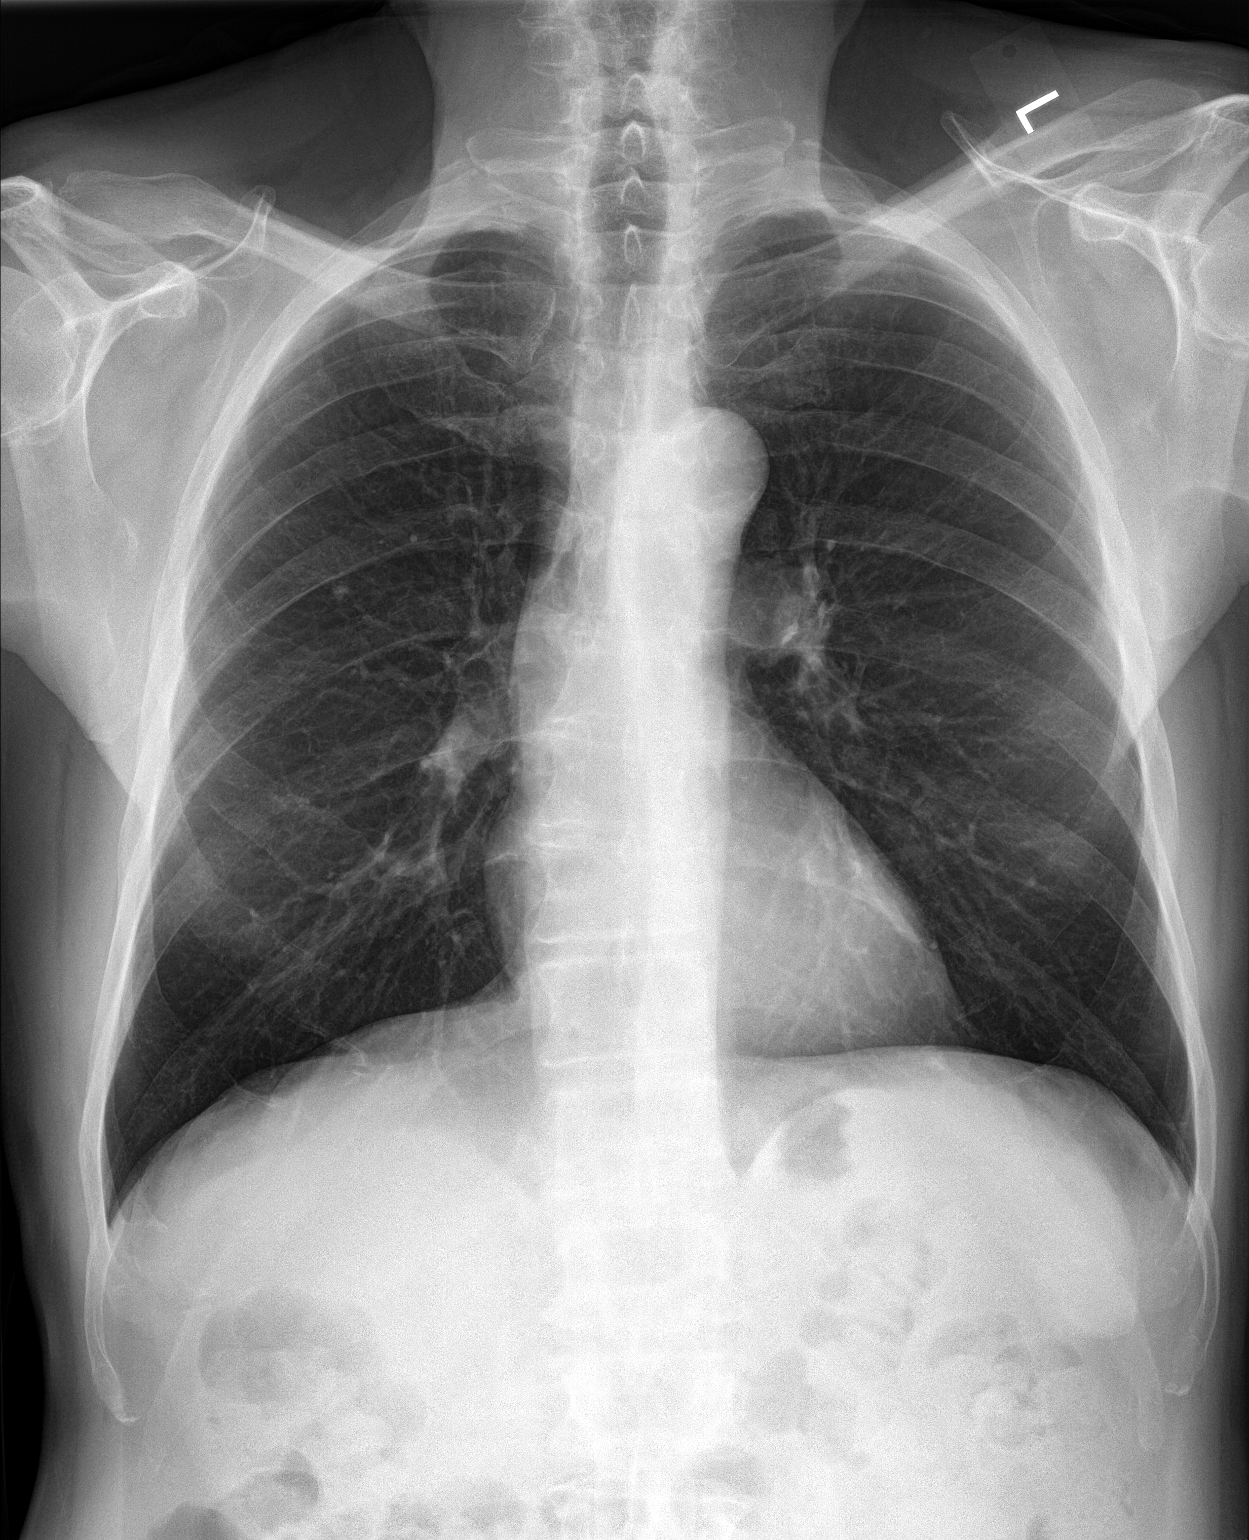

[chest lat]
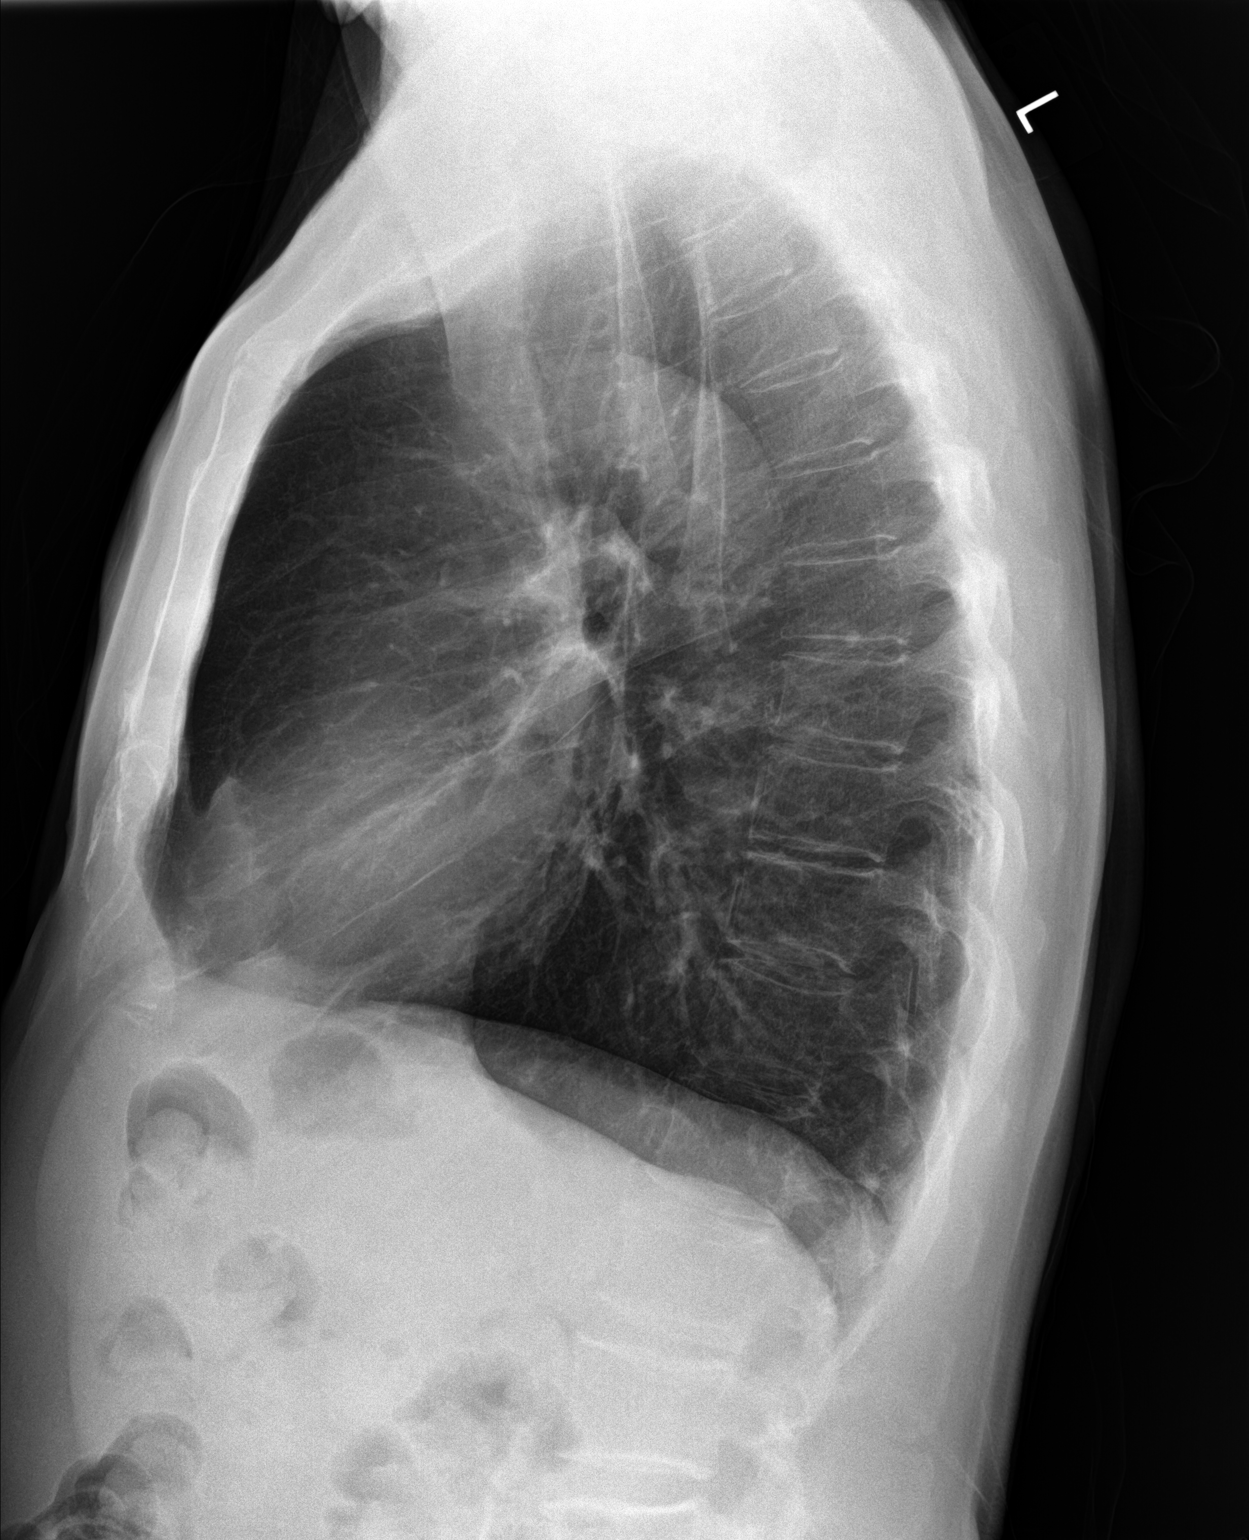

[2 of 2 positions shown; findings below may reference images not displayed]

FINDINGS: The lungs are hyperinflated. There is no focal infiltrate. There is
a 3 mm diameter soft tissue density nodule in the right mid lung.
There is no pleural effusion. The heart and pulmonary vascularity
are normal. There is calcification in the wall of the thoracic
aorta. The trachea is midline. The bony thorax exhibits no acute
abnormality.
IMPRESSION: Chronic bronchitic changes. Tiny nodule in the right mid lung which
nonspecific. In the absence of any previous chest x-rays,
noncontrast chest CT scanning would be useful to exclude occult
malignancy.

Thoracic aortic atherosclerosis.

## 2019-02-13 DIAGNOSIS — C670 Malignant neoplasm of trigone of bladder: Secondary | ICD-10-CM | POA: Diagnosis not present

## 2019-04-02 ENCOUNTER — Other Ambulatory Visit: Payer: Self-pay

## 2019-04-02 DIAGNOSIS — L820 Inflamed seborrheic keratosis: Secondary | ICD-10-CM | POA: Diagnosis not present

## 2019-04-02 DIAGNOSIS — D0439 Carcinoma in situ of skin of other parts of face: Secondary | ICD-10-CM | POA: Diagnosis not present

## 2019-04-02 DIAGNOSIS — L570 Actinic keratosis: Secondary | ICD-10-CM | POA: Diagnosis not present

## 2019-04-02 DIAGNOSIS — D044 Carcinoma in situ of skin of scalp and neck: Secondary | ICD-10-CM | POA: Diagnosis not present

## 2019-04-02 DIAGNOSIS — C4492 Squamous cell carcinoma of skin, unspecified: Secondary | ICD-10-CM

## 2019-04-02 HISTORY — DX: Squamous cell carcinoma of skin, unspecified: C44.92

## 2019-04-12 DIAGNOSIS — Z23 Encounter for immunization: Secondary | ICD-10-CM | POA: Diagnosis not present

## 2019-04-19 DIAGNOSIS — D0439 Carcinoma in situ of skin of other parts of face: Secondary | ICD-10-CM | POA: Diagnosis not present

## 2019-05-22 ENCOUNTER — Other Ambulatory Visit: Payer: Self-pay

## 2019-05-31 DIAGNOSIS — H2511 Age-related nuclear cataract, right eye: Secondary | ICD-10-CM | POA: Diagnosis not present

## 2019-05-31 DIAGNOSIS — H5201 Hypermetropia, right eye: Secondary | ICD-10-CM | POA: Diagnosis not present

## 2019-08-20 DIAGNOSIS — C670 Malignant neoplasm of trigone of bladder: Secondary | ICD-10-CM | POA: Diagnosis not present

## 2019-10-25 ENCOUNTER — Ambulatory Visit (INDEPENDENT_AMBULATORY_CARE_PROVIDER_SITE_OTHER): Payer: Medicare Other | Admitting: Family Medicine

## 2019-10-25 ENCOUNTER — Encounter: Payer: Self-pay | Admitting: Family Medicine

## 2019-10-25 ENCOUNTER — Other Ambulatory Visit: Payer: Self-pay

## 2019-10-25 VITALS — BP 129/86 | HR 77 | Temp 97.0°F | Ht 67.0 in | Wt 130.0 lb

## 2019-10-25 DIAGNOSIS — Z0000 Encounter for general adult medical examination without abnormal findings: Secondary | ICD-10-CM | POA: Diagnosis not present

## 2019-10-25 DIAGNOSIS — E559 Vitamin D deficiency, unspecified: Secondary | ICD-10-CM

## 2019-10-25 DIAGNOSIS — Z13220 Encounter for screening for lipoid disorders: Secondary | ICD-10-CM

## 2019-10-25 DIAGNOSIS — Z125 Encounter for screening for malignant neoplasm of prostate: Secondary | ICD-10-CM

## 2019-10-25 DIAGNOSIS — Z130 Encounter for screening for diseases of the blood and blood-forming organs and certain disorders involving the immune mechanism: Secondary | ICD-10-CM | POA: Diagnosis not present

## 2019-10-25 DIAGNOSIS — Z136 Encounter for screening for cardiovascular disorders: Secondary | ICD-10-CM | POA: Diagnosis not present

## 2019-10-25 NOTE — Progress Notes (Signed)
Subjective:  Patient ID: Curtis Patel, male    DOB: 12-16-1940  Age: 79 y.o. MRN: [id number]  CC: Medical Management of Chronic Issues   HPI Curtis Patel presents for complete physical examination.  He has no complaints and takes no medications on a regular basis.  Depression screen [facility] 2/9 10/25/2019 08/17/2016 07/28/2016  Decreased Interest 0 0 0  Down, Depressed, Hopeless 0 0 0  PHQ - 2 Score 0 0 0    History Curtis Patel has a past medical history of Bladder cancer ([place]) (03/11/2017), Frequency of urination, History of basal cell carcinoma (BCC) of skin, History of exercise stress test (08/26/2016), History of malignant neoplasm of skin, Nocturia, and Wears dentures.   He has a past surgical history that includes Transurethral resection of bladder tumor (N/A, 03/09/2017); Cystoscopy w/ ureteral stent placement (Bilateral, 03/09/2017); Cataract extraction (Left, 2000); Dupuytren contracture release (Left, 09-22-2010   dr Fredna Dow [facility]); Transurethral resection of bladder tumor (N/A, 04/27/2017); and Cystoscopy w/ ureteral stent placement (Bilateral, 04/27/2017).   His family history is not on file.He reports that he quit smoking about 56 years ago. His smoking use included cigarettes. He quit after 5.00 years of use. He has never used smokeless tobacco. He reports that he does not drink alcohol or use drugs.    ROS Review of Systems  Constitutional: Negative for activity change, fatigue and unexpected weight change.  HENT: Negative for congestion, ear pain, hearing loss, postnasal drip and trouble swallowing.   Eyes: Negative for pain and visual disturbance.  Respiratory: Negative for cough, chest tightness and shortness of breath.   Cardiovascular: Negative for chest pain, palpitations and leg swelling.  Gastrointestinal: Negative for abdominal distention, abdominal pain, blood in stool, constipation, diarrhea, nausea and vomiting.  Endocrine: Negative for cold intolerance, heat  intolerance and polydipsia.  Genitourinary: Negative for difficulty urinating, dysuria, flank pain, frequency and urgency.  Musculoskeletal: Negative for arthralgias and joint swelling.  Skin: Negative for color change, rash and wound.  Neurological: Negative for dizziness, syncope, speech difficulty, weakness, light-headedness, numbness and headaches.  Hematological: Does not bruise/bleed easily.  Psychiatric/Behavioral: Negative for confusion, decreased concentration, dysphoric mood and sleep disturbance. The patient is not nervous/anxious.     Objective:  BP 129/86   Pulse 77   Temp (!) 97 F (36.1 C) (Temporal)   Ht '5\' 7"'  (1.702 m)   Wt 130 lb (59 kg)   BMI 20.36 kg/m   BP Readings from Last 3 Encounters:  10/25/19 129/86  04/27/17 112/61  03/09/17 (!) 143/71    Wt Readings from Last 3 Encounters:  10/25/19 130 lb (59 kg)  04/27/17 120 lb 8 oz (54.7 kg)  03/09/17 131 lb (59.4 kg)     Physical Exam Constitutional:      Appearance: He is well-developed.  HENT:     Head: Normocephalic and atraumatic.  Eyes:     Pupils: Pupils are equal, round, and reactive to light.  Neck:     Thyroid: No thyromegaly.     Trachea: No tracheal deviation.  Cardiovascular:     Rate and Rhythm: Normal rate and regular rhythm.     Heart sounds: Normal heart sounds. No murmur. No friction rub. No gallop.   Pulmonary:     Breath sounds: Normal breath sounds. No wheezing or rales.  Abdominal:     General: Bowel sounds are normal. There is no distension.     Palpations: Abdomen is soft. There is no mass.     Tenderness:  There is no abdominal tenderness.     Hernia: There is no hernia in the left inguinal area.  Genitourinary:    Penis: Normal.      Testes: Normal.  Musculoskeletal:        General: Normal range of motion.     Cervical Patel: Normal range of motion.  Lymphadenopathy:     Cervical: No cervical adenopathy.  Skin:    General: Skin is warm and dry.  Neurological:      Mental Status: He is alert and oriented to person, place, and time.       Assessment & Plan:   Curtis Patel was seen today for medical management of chronic issues.  Diagnoses and all orders for this visit:  Well adult exam -     CMP14+EGFR -     PSA Total (Reflex To Free) -     Urinalysis -     VITAMIN D 25 Hydroxy (Vit-D Deficiency, Fractures) -     Lipid panel  Screening for prostate cancer -     PSA Total (Reflex To Free)  Vitamin D deficiency -     VITAMIN D 25 Hydroxy (Vit-D Deficiency, Fractures)  Screening for cholesterol level -     Lipid panel  Screening for deficiency anemia       I have discontinued Curtis Patel. Curtis Patel's aspirin EC, senna-docusate, and cephALEXin.  Allergies as of 10/25/2019   No Known Allergies     Medication List       Accurate as of October 25, 2019 11:59 PM. If you have any questions, ask your nurse or doctor.        STOP taking these medications   aspirin EC 81 MG tablet Stopped by: Claretta Fraise, MD   cephALEXin 500 MG capsule Commonly known as: KEFLEX Stopped by: Claretta Fraise, MD   senna-docusate 8.6-50 MG tablet Commonly known as: Senokot-S Stopped by: Claretta Fraise, MD        Follow-up: Return in about 1 year (around 10/24/2020), or if symptoms worsen or fail to improve.  Claretta Fraise, M.D.

## 2019-10-26 LAB — LIPID PANEL
Chol/HDL Ratio: 5.5 ratio — ABNORMAL HIGH (ref 0.0–5.0)
Cholesterol, Total: 199 mg/dL (ref 100–199)
HDL: 36 mg/dL — ABNORMAL LOW (ref >39)
LDL Chol Calc (NIH): 143 mg/dL — ABNORMAL HIGH (ref 0–99)
Triglycerides: 112 mg/dL (ref 0–149)
VLDL Cholesterol Cal: 20 mg/dL (ref 5–40)

## 2019-10-26 LAB — CMP14+EGFR
ALT: 8 IU/L (ref 0–44)
AST: 17 IU/L (ref 0–40)
Albumin/Globulin Ratio: 1.7 (ref 1.2–2.2)
Albumin: 4.2 g/dL (ref 3.7–4.7)
Alkaline Phosphatase: 93 IU/L (ref 39–117)
BUN/Creatinine Ratio: 12 (ref 10–24)
BUN: 15 mg/dL (ref 8–27)
Bilirubin Total: 0.3 mg/dL (ref 0.0–1.2)
CO2: 24 mmol/L (ref 20–29)
Calcium: 9 mg/dL (ref 8.6–10.2)
Chloride: 103 mmol/L (ref 96–106)
Creatinine, Ser: 1.22 mg/dL (ref 0.76–1.27)
GFR calc Af Amer: 65 mL/min/{1.73_m2} (ref >59)
GFR calc non Af Amer: 56 mL/min/{1.73_m2} — ABNORMAL LOW (ref >59)
Globulin, Total: 2.5 g/dL (ref 1.5–4.5)
Glucose: 80 mg/dL (ref 65–99)
Potassium: 4.2 mmol/L (ref 3.5–5.2)
Sodium: 141 mmol/L (ref 134–144)
Total Protein: 6.7 g/dL (ref 6.0–8.5)

## 2019-10-26 LAB — PSA TOTAL (REFLEX TO FREE): Prostate Specific Ag, Serum: 3.7 ng/mL (ref 0.0–4.0)

## 2019-10-26 LAB — VITAMIN D 25 HYDROXY (VIT D DEFICIENCY, FRACTURES): Vit D, 25-Hydroxy: 30.3 ng/mL (ref 30.0–100.0)

## 2019-10-29 ENCOUNTER — Encounter: Payer: Self-pay | Admitting: Family Medicine

## 2020-01-23 DIAGNOSIS — Z23 Encounter for immunization: Secondary | ICD-10-CM | POA: Diagnosis not present

## 2020-02-18 DIAGNOSIS — C670 Malignant neoplasm of trigone of bladder: Secondary | ICD-10-CM | POA: Diagnosis not present

## 2020-02-22 DIAGNOSIS — Z23 Encounter for immunization: Secondary | ICD-10-CM | POA: Diagnosis not present

## 2020-04-09 ENCOUNTER — Encounter: Payer: Self-pay | Admitting: Dermatology

## 2020-04-09 ENCOUNTER — Other Ambulatory Visit: Payer: Self-pay

## 2020-04-09 ENCOUNTER — Ambulatory Visit (INDEPENDENT_AMBULATORY_CARE_PROVIDER_SITE_OTHER): Payer: Medicare Other | Admitting: Dermatology

## 2020-04-09 DIAGNOSIS — L821 Other seborrheic keratosis: Secondary | ICD-10-CM

## 2020-04-09 DIAGNOSIS — Z85828 Personal history of other malignant neoplasm of skin: Secondary | ICD-10-CM

## 2020-04-09 DIAGNOSIS — Z86007 Personal history of in-situ neoplasm of skin: Secondary | ICD-10-CM | POA: Diagnosis not present

## 2020-04-09 DIAGNOSIS — L259 Unspecified contact dermatitis, unspecified cause: Secondary | ICD-10-CM

## 2020-04-09 DIAGNOSIS — Z86018 Personal history of other benign neoplasm: Secondary | ICD-10-CM | POA: Diagnosis not present

## 2020-04-09 DIAGNOSIS — Z87898 Personal history of other specified conditions: Secondary | ICD-10-CM

## 2020-04-09 DIAGNOSIS — L570 Actinic keratosis: Secondary | ICD-10-CM

## 2020-04-09 MED ORDER — APEXICON E 0.05 % EX CREA: 1.0000 "application " | TOPICAL_CREAM | Freq: Two times a day (BID) | CUTANEOUS | 0 refills | Status: DC

## 2020-06-03 DIAGNOSIS — H2511 Age-related nuclear cataract, right eye: Secondary | ICD-10-CM | POA: Diagnosis not present

## 2020-06-03 DIAGNOSIS — H524 Presbyopia: Secondary | ICD-10-CM | POA: Diagnosis not present

## 2021-01-25 ENCOUNTER — Encounter: Payer: Self-pay | Admitting: Dermatology

## 2021-01-25 NOTE — Progress Notes (Signed)
   Follow-Up Visit   Subjective  Curtis Patel is a 80 y.o. male who presents for the following: Skin Problem (spot on nose--noticed for 3 weeks--no change in size--no pain--not itchy--some tenderness).  History of skin cancer, general skin check, new spot on nose for few weeks. Location:  Duration:  Quality:  Associated Signs/Symptoms: Modifying Factors:  Severity:  Timing: Context:   Objective  Well appearing patient in no apparent distress; mood and affect are within normal limits. Left Anterior Mandible, Left Ear, Left Postauricular Area, Right Postauricular Area, Right Triangular Fossa Half dozen 3 to 5 mm gritty and hornlike pink crusts  Left Forehead Tan fla Koska P typical.  Ttopped textured 6 mm papule.  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  No sign recurrence  Mid Supratip of Nose Patches of papular subacute dermatitis, etiology uncertain    A full examination was performed including scalp, head, eyes, ears, nose, lips, neck, chest, axillae, abdomen, back, buttocks, bilateral upper extremities, bilateral lower extremities, hands, feet, fingers, toes, fingernails, and toenails. All findings within normal limits unless otherwise noted below.  Area beneath underwear not fully examined.   Assessment & Plan    AK (actinic keratosis) (5) Left Ear; Right Triangular Fossa; Left Anterior Mandible; Left Postauricular Area; Right Postauricular Area  Destruction of lesion - Left Anterior Mandible, Left Ear, Left Postauricular Area, Right Postauricular Area, Right Triangular Fossa Complexity: simple   Destruction method: cryotherapy   Informed consent: discussed and consent obtained   Timeout:  patient name, date of birth, surgical site, and procedure verified Lesion destroyed using liquid nitrogen: Yes   Cryotherapy cycles:  3 Outcome: patient tolerated procedure  well with no complications   Post-procedure details: wound care instructions given    Seborrheic keratosis Left Forehead  Stable.  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  Contact dermatitis, unspecified contact dermatitis type, unspecified trigger Mid Supratip of Nose  Sample only Apexicon:, Avoid brown eyes, maximum 10 days.  diflorasone-emollient (APEXICON E) 0.05 % CREA - Mid Supratip of Nose Apply 1 application topically 2 (two) times daily.      I, Lavonna Monarch, MD, have reviewed all documentation for this visit.  The documentation on 01/25/21 for the exam, diagnosis, procedures, and orders are all accurate and complete.

## 2021-02-17 ENCOUNTER — Ambulatory Visit (INDEPENDENT_AMBULATORY_CARE_PROVIDER_SITE_OTHER): Payer: Medicare Other | Admitting: Dermatology

## 2021-02-17 ENCOUNTER — Encounter: Payer: Self-pay | Admitting: Dermatology

## 2021-02-17 ENCOUNTER — Other Ambulatory Visit: Payer: Self-pay

## 2021-02-17 DIAGNOSIS — Z1283 Encounter for screening for malignant neoplasm of skin: Secondary | ICD-10-CM

## 2021-02-17 DIAGNOSIS — L720 Epidermal cyst: Secondary | ICD-10-CM | POA: Diagnosis not present

## 2021-02-17 DIAGNOSIS — D044 Carcinoma in situ of skin of scalp and neck: Secondary | ICD-10-CM

## 2021-02-17 DIAGNOSIS — C44311 Basal cell carcinoma of skin of nose: Secondary | ICD-10-CM

## 2021-02-17 DIAGNOSIS — D485 Neoplasm of uncertain behavior of skin: Secondary | ICD-10-CM

## 2021-02-17 DIAGNOSIS — Z85828 Personal history of other malignant neoplasm of skin: Secondary | ICD-10-CM

## 2021-02-17 DIAGNOSIS — L821 Other seborrheic keratosis: Secondary | ICD-10-CM | POA: Diagnosis not present

## 2021-02-17 DIAGNOSIS — L570 Actinic keratosis: Secondary | ICD-10-CM

## 2021-02-17 NOTE — Patient Instructions (Signed)

## 2021-02-20 ENCOUNTER — Encounter: Payer: Self-pay | Admitting: Dermatology

## 2021-02-20 NOTE — Progress Notes (Signed)
Follow-Up Visit   Subjective  Curtis Patel is a 80 y.o. male who presents for the following: Follow-up (Nose previous ln2 right nose lesion and its still present also left ear still has crusty area also previous ln2. Patient has history of bcc scc and spindle cell ).  Annual skin check, several new areas to examine. Location:  Duration:  Quality:  Associated Signs/Symptoms: Modifying Factors:  Severity:  Timing: Context:   Objective  Well appearing patient in no apparent distress; mood and affect are within normal limits. Mid Tip of Nose Pearly pigmented 6 mm lesion; dermoscopy suggests pigmented BCC.     Right Anterior Neck 6 mm crust suggestive of superficial carcinoma     Left Dorsal Hand Waxy 5 mm crust, rule out early carcinoma     Left Forearm - Posterior, Left Triangular Fossa (3), Right Scaphoid Fossa Hornlike 2 to 3 mm pink crusts  Chest (Upper Torso, Anterior) Upper body skin check no sign of melanoma.  3 possible nonmelanoma skin cancers will be biopsied.    All skin waist up examined.   Assessment & Plan    Neoplasm of uncertain behavior of skin (3) Mid Tip of Nose  Skin / nail biopsy Type of biopsy: tangential   Informed consent: discussed and consent obtained   Timeout: patient name, date of birth, surgical site, and procedure verified   Anesthesia: the lesion was anesthetized in a standard fashion   Anesthetic:  1% lidocaine w/ epinephrine 1-100,000 local infiltration Instrument used: flexible razor blade   Hemostasis achieved with: aluminum chloride and electrodesiccation   Outcome: patient tolerated procedure well   Post-procedure details: wound care instructions given    Specimen 1 - Surgical pathology Differential Diagnosis: bcc scc  Check Margins: No  Right Anterior Neck  Skin / nail biopsy Type of biopsy: tangential   Informed consent: discussed and consent obtained   Timeout: patient name, date of birth, surgical  site, and procedure verified   Anesthesia: the lesion was anesthetized in a standard fashion   Anesthetic:  1% lidocaine w/ epinephrine 1-100,000 local infiltration Instrument used: flexible razor blade   Hemostasis achieved with: aluminum chloride and electrodesiccation   Outcome: patient tolerated procedure well   Post-procedure details: wound care instructions given    Specimen 2 - Surgical pathology Differential Diagnosis: bcc scc  Check Margins: No  Left Dorsal Hand  Skin / nail biopsy Type of biopsy: tangential   Informed consent: discussed and consent obtained   Timeout: patient name, date of birth, surgical site, and procedure verified   Anesthesia: the lesion was anesthetized in a standard fashion   Anesthetic:  1% lidocaine w/ epinephrine 1-100,000 local infiltration Instrument used: flexible razor blade   Hemostasis achieved with: aluminum chloride and electrodesiccation   Outcome: patient tolerated procedure well   Post-procedure details: wound care instructions given    Specimen 3 - Surgical pathology Differential Diagnosis: bcc scc  Check Margins: No  AK (actinic keratosis) (5) Left Forearm - Posterior; Right Scaphoid Fossa; Left Triangular Fossa (3)  Destruction of lesion - Left Forearm - Posterior, Left Triangular Fossa, Right Scaphoid Fossa Complexity: simple   Destruction method: cryotherapy   Informed consent: discussed and consent obtained   Timeout:  patient name, date of birth, surgical site, and procedure verified Lesion destroyed using liquid nitrogen: Yes   Cryotherapy cycles:  3  Screening exam for skin cancer Chest (Upper Torso, Anterior)  Annual skin examination.      I, Lavonna Monarch,  MD, have reviewed all documentation for this visit.  The documentation on 02/20/21 for the exam, diagnosis, procedures, and orders are all accurate and complete.

## 2021-02-23 ENCOUNTER — Telehealth: Payer: Self-pay | Admitting: *Deleted

## 2021-02-23 NOTE — Telephone Encounter (Signed)
Path to patient. Mohs information sent to the skin surgery center.

## 2021-02-23 NOTE — Telephone Encounter (Signed)
-----   Message from Lavonna Monarch, MD sent at 02/20/2021  6:32 PM EDT ----- Please let patient know that even though this likely represents a new skin cancer rather than post Mohs recurrence, Dr. Darene Lamer still feels it is best handled with Mohs surgery.

## 2021-05-01 DIAGNOSIS — Z23 Encounter for immunization: Secondary | ICD-10-CM | POA: Diagnosis not present

## 2021-05-05 DIAGNOSIS — C44311 Basal cell carcinoma of skin of nose: Secondary | ICD-10-CM | POA: Diagnosis not present

## 2021-05-20 ENCOUNTER — Telehealth: Payer: Self-pay | Admitting: Family Medicine

## 2021-05-20 NOTE — Telephone Encounter (Signed)
Left message for pt to confirm whether we are still his PCP office or not since it has been so long since we last saw him and if we are still his PCP office, then he is over due for Check Up with PCP and AWV that needs to be scheduled also.

## 2021-06-25 ENCOUNTER — Encounter: Payer: Self-pay | Admitting: Family Medicine

## 2021-06-25 ENCOUNTER — Ambulatory Visit (INDEPENDENT_AMBULATORY_CARE_PROVIDER_SITE_OTHER): Payer: Medicare Other | Admitting: Family Medicine

## 2021-06-25 VITALS — BP 193/86 | HR 70 | Temp 97.5°F | Ht 67.0 in | Wt 132.4 lb

## 2021-06-25 DIAGNOSIS — C679 Malignant neoplasm of bladder, unspecified: Secondary | ICD-10-CM | POA: Diagnosis not present

## 2021-06-25 DIAGNOSIS — Z0001 Encounter for general adult medical examination with abnormal findings: Secondary | ICD-10-CM | POA: Diagnosis not present

## 2021-06-25 DIAGNOSIS — Z125 Encounter for screening for malignant neoplasm of prostate: Secondary | ICD-10-CM

## 2021-06-25 DIAGNOSIS — R030 Elevated blood-pressure reading, without diagnosis of hypertension: Secondary | ICD-10-CM | POA: Diagnosis not present

## 2021-06-25 DIAGNOSIS — N400 Enlarged prostate without lower urinary tract symptoms: Secondary | ICD-10-CM

## 2021-06-25 DIAGNOSIS — Z13220 Encounter for screening for lipoid disorders: Secondary | ICD-10-CM

## 2021-06-25 DIAGNOSIS — Z130 Encounter for screening for diseases of the blood and blood-forming organs and certain disorders involving the immune mechanism: Secondary | ICD-10-CM | POA: Diagnosis not present

## 2021-06-25 DIAGNOSIS — Z0000 Encounter for general adult medical examination without abnormal findings: Secondary | ICD-10-CM

## 2021-06-25 NOTE — Progress Notes (Addendum)
Subjective:  Patient ID: Curtis Patel, male    DOB: 1940-08-19  Age: 80 y.o. MRN: [id number]  CC: Annual Exam   HPI [name] presents for Complete physical.   Depression screen [facility] 2/9 06/25/2021 10/25/2019 08/17/2016  Decreased Interest 0 0 0  Down, Depressed, Hopeless 0 0 0  PHQ - 2 Score 0 0 0    History Dravin has a past medical history of Atypical mole (09/15/2010), Basal cell carcinoma (09/15/2010), Bladder cancer ([place]) (03/11/2017), Frequency of urination, History of basal cell carcinoma (BCC) of skin, History of exercise stress test (08/26/2016), History of malignant neoplasm of skin, Nocturia, Squamous cell carcinoma of skin (04/02/2019), and Wears dentures.   He has a past surgical history that includes Transurethral resection of bladder tumor (N/A, 03/09/2017); Cystoscopy w/ ureteral stent placement (Bilateral, 03/09/2017); Cataract extraction (Left, 2000); Dupuytren contracture release (Left, 09-22-2010   dr Fredna Dow [facility]); Transurethral resection of bladder tumor (N/A, 04/27/2017); and Cystoscopy w/ ureteral stent placement (Bilateral, 04/27/2017).   His family history is not on file.He reports that he quit smoking about 58 years ago. His smoking use included cigarettes. He has never used smokeless tobacco. He reports that he does not drink alcohol and does not use drugs.    ROS Review of Systems  Constitutional: Negative.   HENT: Negative.    Eyes:  Negative for visual disturbance.  Respiratory:  Negative for cough and shortness of breath.   Cardiovascular:  Negative for chest pain and leg swelling.  Gastrointestinal:  Negative for abdominal pain, diarrhea, nausea and vomiting.  Genitourinary:  Negative for difficulty urinating.  Musculoskeletal:  Negative for arthralgias and myalgias.  Skin:  Negative for rash.  Neurological:  Negative for headaches.  Psychiatric/Behavioral:  Negative for sleep disturbance.    Objective:  BP (!) 193/86    Pulse 70    Temp  (!) 97.5 F (36.4 C)    Ht '5\' 7"'  (1.702 m)    Wt 132 lb 6.4 oz (60.1 kg)    SpO2 98%    BMI 20.74 kg/m   BP Readings from Last 3 Encounters:  06/25/21 (!) 193/86  10/25/19 129/86  04/27/17 112/61    Wt Readings from Last 3 Encounters:  06/25/21 132 lb 6.4 oz (60.1 kg)  10/25/19 130 lb (59 kg)  04/27/17 120 lb 8 oz (54.7 kg)     Physical Exam Constitutional:      General: He is not in acute distress.    Appearance: He is well-developed.  HENT:     Head: Normocephalic and atraumatic.     Right Ear: External ear normal.     Left Ear: External ear normal.     Nose: Nose normal.  Eyes:     Conjunctiva/sclera: Conjunctivae normal.     Pupils: Pupils are equal, round, and reactive to light.  Cardiovascular:     Rate and Rhythm: Normal rate and regular rhythm.     Heart sounds: Normal heart sounds. No murmur heard. Pulmonary:     Effort: Pulmonary effort is normal. No respiratory distress.     Breath sounds: Normal breath sounds. No wheezing or rales.  Abdominal:     Palpations: Abdomen is soft.     Tenderness: There is no abdominal tenderness.  Musculoskeletal:        General: Normal range of motion.     Cervical back: Normal range of motion and neck supple.  Skin:    General: Skin is warm and dry.  Neurological:  Mental Status: He is alert and oriented to person, place, and time.     Deep Tendon Reflexes: Reflexes are normal and symmetric.  Psychiatric:        Behavior: Behavior normal.        Thought Content: Thought content normal.        Judgment: Judgment normal.      Assessment & Plan:   Rivers was seen today for annual exam.  Diagnoses and all orders for this visit:  Well adult exam -     CMP14+EGFR -     Lipid panel  Screening for prostate cancer  Screening for cholesterol level -     Lipid panel  Screening for deficiency anemia -     CBC with Differential/Platelet  Malignant neoplasm of urinary bladder, unspecified site (HCC) -     CBC  with Differential/Platelet -     PSA, total and free  Benign prostatic hyperplasia without lower urinary tract symptoms -     PSA, total and free  Elevated BP without diagnosis of hypertension       I have discontinued Otila Back. Melone's ApexiCon E.  Allergies as of 06/25/2021   No Known Allergies      Medication List        Accurate as of June 25, 2021 11:59 PM. If you have any questions, ask your nurse or doctor.          STOP taking these medications    ApexiCon E 0.05 % Crea Generic drug: diflorasone-emollient Stopped by: Claretta Fraise, MD         Follow-up: No follow-ups on file.  Claretta Fraise, M.D.

## 2021-06-26 LAB — CMP14+EGFR
ALT: 8 IU/L (ref 0–44)
AST: 19 IU/L (ref 0–40)
Albumin/Globulin Ratio: 2 (ref 1.2–2.2)
Albumin: 4.3 g/dL (ref 3.7–4.7)
Alkaline Phosphatase: 106 IU/L (ref 44–121)
BUN/Creatinine Ratio: 13 (ref 10–24)
BUN: 17 mg/dL (ref 8–27)
Bilirubin Total: 0.5 mg/dL (ref 0.0–1.2)
CO2: 22 mmol/L (ref 20–29)
Calcium: 9.5 mg/dL (ref 8.6–10.2)
Chloride: 100 mmol/L (ref 96–106)
Creatinine, Ser: 1.27 mg/dL (ref 0.76–1.27)
Globulin, Total: 2.2 g/dL (ref 1.5–4.5)
Glucose: 90 mg/dL (ref 70–99)
Potassium: 4.1 mmol/L (ref 3.5–5.2)
Sodium: 141 mmol/L (ref 134–144)
Total Protein: 6.5 g/dL (ref 6.0–8.5)
eGFR: 57 mL/min/{1.73_m2} — ABNORMAL LOW (ref >59)

## 2021-06-26 LAB — CBC WITH DIFFERENTIAL/PLATELET
Basophils Absolute: 0.1 10*3/uL (ref 0.0–0.2)
Basos: 1 %
EOS (ABSOLUTE): 0.1 10*3/uL (ref 0.0–0.4)
Eos: 1 %
Hematocrit: 42.2 % (ref 37.5–51.0)
Hemoglobin: 14.7 g/dL (ref 13.0–17.7)
Immature Grans (Abs): 0 10*3/uL (ref 0.0–0.1)
Immature Granulocytes: 0 %
Lymphocytes Absolute: 2.4 10*3/uL (ref 0.7–3.1)
Lymphs: 28 %
MCH: 29.2 pg (ref 26.6–33.0)
MCHC: 34.8 g/dL (ref 31.5–35.7)
MCV: 84 fL (ref 79–97)
Monocytes Absolute: 0.5 10*3/uL (ref 0.1–0.9)
Monocytes: 6 %
Neutrophils Absolute: 5.5 10*3/uL (ref 1.4–7.0)
Neutrophils: 64 %
Platelets: 276 10*3/uL (ref 150–450)
RBC: 5.04 x10E6/uL (ref 4.14–5.80)
RDW: 12 % (ref 11.6–15.4)
WBC: 8.5 10*3/uL (ref 3.4–10.8)

## 2021-06-26 LAB — LIPID PANEL
Chol/HDL Ratio: 4.7 ratio (ref 0.0–5.0)
Cholesterol, Total: 212 mg/dL — ABNORMAL HIGH (ref 100–199)
HDL: 45 mg/dL (ref >39)
LDL Chol Calc (NIH): 150 mg/dL — ABNORMAL HIGH (ref 0–99)
Triglycerides: 93 mg/dL (ref 0–149)
VLDL Cholesterol Cal: 17 mg/dL (ref 5–40)

## 2021-06-26 LAB — PSA, TOTAL AND FREE
PSA, Free Pct: 9.6 %
PSA, Free: 0.79 ng/mL
Prostate Specific Ag, Serum: 8.2 ng/mL — ABNORMAL HIGH (ref 0.0–4.0)

## 2021-07-06 MED ORDER — ROSUVASTATIN CALCIUM 10 MG PO TABS: 10.0000 mg | ORAL_TABLET | Freq: Every day | ORAL | 1 refills | Status: DC

## 2021-07-06 NOTE — Addendum Note (Signed)
Addended by: Claretta Fraise on: 07/06/2021 09:06 PM   Modules accepted: Orders

## 2021-07-15 ENCOUNTER — Telehealth: Payer: Self-pay | Admitting: Family Medicine

## 2021-07-29 ENCOUNTER — Ambulatory Visit (INDEPENDENT_AMBULATORY_CARE_PROVIDER_SITE_OTHER): Payer: Medicare Other

## 2021-07-29 VITALS — BP 146/80 | Ht 67.0 in | Wt 132.0 lb

## 2021-07-29 DIAGNOSIS — Z0000 Encounter for general adult medical examination without abnormal findings: Secondary | ICD-10-CM | POA: Diagnosis not present

## 2021-07-29 NOTE — Patient Instructions (Signed)
Curtis Patel , Thank you for taking time to come for your Medicare Wellness Visit. I appreciate your ongoing commitment to your health goals. Please review the following plan we discussed and let me know if I can assist you in the future.   Screening recommendations/referrals: Colonoscopy: No longer required due to age.  Recommended yearly ophthalmology/optometry visit for glaucoma screening and checkup Recommended yearly dental visit for hygiene and checkup  Vaccinations: Influenza vaccine: 04/2021. Repeat annually  Pneumococcal vaccine: Done 01/03/2009. Second dose due at your convenience. Tdap vaccine: Done 09/19/2013 Repeat in 10 years  Shingles vaccine: Shingles discussed.   Covid-19: Please bring vaccine card to next visit so dates can be documented. Thank you!  Advanced directives: Advance directive discussed with you today. Even though you declined this today, please call our office should you change your mind, and we can give you the proper paperwork for you to fill out.   Conditions/risks identified: Aim for 30 minutes of exercise or brisk walking each day, drink 6-8 glasses of water and eat lots of fruits and vegetables. KEEP UP THE GOOD WORK!!  Next appointment: Follow up in one year for your annual wellness visit. 2024.  Preventive Care 81 Years and Older, Male  Preventive care refers to lifestyle choices and visits with your health care provider that can promote health and wellness. What does preventive care include? A yearly physical exam. This is also called an annual well check. Dental exams once or twice a year. Routine eye exams. Ask your health care provider how often you should have your eyes checked. Personal lifestyle choices, including: Daily care of your teeth and gums. Regular physical activity. Eating a healthy diet. Avoiding tobacco and drug use. Limiting alcohol use. Practicing safe sex. Taking low doses of aspirin every day. Taking vitamin and mineral  supplements as recommended by your health care provider. What happens during an annual well check? The services and screenings done by your health care provider during your annual well check will depend on your age, overall health, lifestyle risk factors, and family history of disease. Counseling  Your health care provider may ask you questions about your: Alcohol use. Tobacco use. Drug use. Emotional well-being. Home and relationship well-being. Sexual activity. Eating habits. History of falls. Memory and ability to understand (cognition). Work and work Statistician. Screening  You may have the following tests or measurements: Height, weight, and BMI. Blood pressure. Lipid and cholesterol levels. These may be checked every 5 years, or more frequently if you are over 54 years old. Skin check. Lung cancer screening. You may have this screening every year starting at age 81 if you have a 30-pack-year history of smoking and currently smoke or have quit within the past 15 years. Fecal occult blood test (FOBT) of the stool. You may have this test every year starting at age 81. Flexible sigmoidoscopy or colonoscopy. You may have a sigmoidoscopy every 5 years or a colonoscopy every 10 years starting at age 81. Prostate cancer screening. Recommendations will vary depending on your family history and other risks. Hepatitis C blood test. Hepatitis B blood test. Sexually transmitted disease (STD) testing. Diabetes screening. This is done by checking your blood sugar (glucose) after you have not eaten for a while (fasting). You may have this done every 1-3 years. Abdominal aortic aneurysm (AAA) screening. You may need this if you are a current or former smoker. Osteoporosis. You may be screened starting at age 81 if you are at high risk. Talk with  your health care provider about your test results, treatment options, and if necessary, the need for more tests. Vaccines  Your health care provider  may recommend certain vaccines, such as: Influenza vaccine. This is recommended every year. Tetanus, diphtheria, and acellular pertussis (Tdap, Td) vaccine. You may need a Td booster every 10 years. Zoster vaccine. You may need this after age 81. Pneumococcal 13-valent conjugate (PCV13) vaccine. One dose is recommended after age 81. Pneumococcal polysaccharide (PPSV23) vaccine. One dose is recommended after age 81. Talk to your health care provider about which screenings and vaccines you need and how often you need them. This information is not intended to replace advice given to you by your health care provider. Make sure you discuss any questions you have with your health care provider. Document Released: 07/11/2015 Document Revised: 03/03/2016 Document Reviewed: 04/15/2015 Elsevier Interactive Patient Education  2017 [place] Prevention in the Home Falls can cause injuries. They can happen to people of all ages. There are many things you can do to make your home safe and to help prevent falls. What can I do on the outside of my home? Regularly fix the edges of walkways and driveways and fix any cracks. Remove anything that might make you trip as you walk through a door, such as a raised step or threshold. Trim any bushes or trees on the path to your home. Use bright outdoor lighting. Clear any walking paths of anything that might make someone trip, such as rocks or tools. Regularly check to see if handrails are loose or broken. Make sure that both sides of any steps have handrails. Any raised decks and porches should have guardrails on the edges. Have any leaves, snow, or ice cleared regularly. Use sand or salt on walking paths during winter. Clean up any spills in your garage right away. This includes oil or grease spills. What can I do in the bathroom? Use night lights. Install grab bars by the toilet and in the tub and shower. Do not use towel bars as grab bars. Use  non-skid mats or decals in the tub or shower. If you need to sit down in the shower, use a plastic, non-slip stool. Keep the floor dry. Clean up any water that spills on the floor as soon as it happens. Remove soap buildup in the tub or shower regularly. Attach bath mats securely with double-sided non-slip rug tape. Do not have throw rugs and other things on the floor that can make you trip. What can I do in the bedroom? Use night lights. Make sure that you have a light by your bed that is easy to reach. Do not use any sheets or blankets that are too big for your bed. They should not hang down onto the floor. Have a firm chair that has side arms. You can use this for support while you get dressed. Do not have throw rugs and other things on the floor that can make you trip. What can I do in the kitchen? Clean up any spills right away. Avoid walking on wet floors. Keep items that you use a lot in easy-to-reach places. If you need to reach something above you, use a strong step stool that has a grab bar. Keep electrical cords out of the way. Do not use floor polish or wax that makes floors slippery. If you must use wax, use non-skid floor wax. Do not have throw rugs and other things on the floor that can make you trip.  What can I do with my stairs? Do not leave any items on the stairs. Make sure that there are handrails on both sides of the stairs and use them. Fix handrails that are broken or loose. Make sure that handrails are as long as the stairways. Check any carpeting to make sure that it is firmly attached to the stairs. Fix any carpet that is loose or worn. Avoid having throw rugs at the top or bottom of the stairs. If you do have throw rugs, attach them to the floor with carpet tape. Make sure that you have a light switch at the top of the stairs and the bottom of the stairs. If you do not have them, ask someone to add them for you. What else can I do to help prevent falls? Wear  shoes that: Do not have high heels. Have rubber bottoms. Are comfortable and fit you well. Are closed at the toe. Do not wear sandals. If you use a stepladder: Make sure that it is fully opened. Do not climb a closed stepladder. Make sure that both sides of the stepladder are locked into place. Ask someone to hold it for you, if possible. Clearly mark and make sure that you can see: Any grab bars or handrails. First and last steps. Where the edge of each step is. Use tools that help you move around (mobility aids) if they are needed. These include: Canes. Walkers. Scooters. Crutches. Turn on the lights when you go into a dark area. Replace any light bulbs as soon as they burn out. Set up your furniture so you have a clear path. Avoid moving your furniture around. If any of your floors are uneven, fix them. If there are any pets around you, be aware of where they are. Review your medicines with your doctor. Some medicines can make you feel dizzy. This can increase your chance of falling. Ask your doctor what other things that you can do to help prevent falls. This information is not intended to replace advice given to you by your health care provider. Make sure you discuss any questions you have with your health care provider. Document Released: 04/10/2009 Document Revised: 11/20/2015 Document Reviewed: 07/19/2014 Elsevier Interactive Patient Education  2017 [employer].

## 2021-07-29 NOTE — Progress Notes (Signed)
Subjective:   Curtis Patel is a 81 y.o. male who presents for Medicare Annual/Subsequent preventive examination. Virtual Visit via Telephone Note  I connected with  Curtis Patel on 07/29/21 at  9:45 AM EST by telephone and verified that I am speaking with the correct person using two identifiers.  Location: Patient: HOME Provider: [facility] Persons participating in the virtual visit: patient/Nurse Health Advisor   I discussed the limitations, risks, security and privacy concerns of performing an evaluation and management service by telephone and the availability of in person appointments. The patient expressed understanding and agreed to proceed.  Interactive audio and video telecommunications were attempted between this nurse and patient, however failed, due to patient having technical difficulties OR patient did not have access to video capability.  We continued and completed visit with audio only.  Some vital signs may be absent or patient reported.   Chriss Driver, LPN  Review of Systems     Cardiac Risk Factors include: advanced age (>49men, >4 women);dyslipidemia;male gender  PHONE VISIT. PT AT HOME. NURSE AT [facility].    Objective:    Today's Vitals   07/29/21 0946  Weight: 132 lb (59.9 kg)  Height: 5\' 7"  (1.702 m)   Body mass index is 20.67 kg/m.  Advanced Directives 07/29/2021 04/27/2017 03/01/2017  Does Patient Have a Medical Advance Directive? No Yes Yes  Type of Advance Directive - [place];Living will [place] in Chart? - No - copy requested No - copy requested  Would patient like information on creating a medical advance directive? No - Patient declined - -    Current Medications (verified) Outpatient Encounter Medications as of 07/29/2021  Medication Sig   rosuvastatin (CRESTOR) 10 MG tablet Take 1 tablet (10 mg total) by mouth daily. For cholesterol   No facility-administered  encounter medications on file as of 07/29/2021.    Allergies (verified) Patient has no known allergies.   History: Past Medical History:  Diagnosis Date   Atypical mole 09/15/2010   malignant spindle cell--right nose   Basal cell carcinoma 09/15/2010   left chest curet tx 3   Bladder cancer ([place]) 03/11/2017   Frequency of urination    History of basal cell carcinoma (BCC) of skin    09-15-2010  left face   History of exercise stress test 08/26/2016   Normal echo stress w/ normal LVSF   History of malignant neoplasm of skin    09-15-2010   s/p excision spindle cell neoplasm right nose   Nocturia    Squamous cell carcinoma of skin 04/02/2019   scc in situ--left side nose--cx3, 62fu   Wears dentures    upper   Past Surgical History:  Procedure Laterality Date   CATARACT EXTRACTION Left 2000   per pt no lens implant   CYSTOSCOPY W/ URETERAL STENT PLACEMENT Bilateral 03/09/2017   Procedure: CYSTOSCOPY WITH BILATERAL RETROGRADE PYELOGRAM/ LEFT URETERAL STENT PLACEMENT;  Surgeon: Alexis Frock, MD;  Location: [facility];  Service: Urology;  Laterality: Bilateral;   CYSTOSCOPY W/ URETERAL STENT PLACEMENT Bilateral 04/27/2017   Procedure: CYSTOSCOPY WITH RETROGRADE PYELOGRAM/URETERAL LEFT STENT REPLACEMENT;  Surgeon: Alexis Frock, MD;  Location: [facility];  Service: Urology;  Laterality: Bilateral;   DUPUYTREN CONTRACTURE RELEASE Left 09-22-2010   dr [facility]   left ring and small fingers   TRANSURETHRAL RESECTION OF BLADDER TUMOR N/A 03/09/2017   Procedure: TRANSURETHRAL RESECTION OF BLADDER TUMOR (TURBT);  Surgeon: Alexis Frock, MD;  Location: [facility];  Service: Urology;  Laterality: N/A;   TRANSURETHRAL RESECTION OF BLADDER TUMOR N/A 04/27/2017   Procedure: TRANSURETHRAL RESECTION OF BLADDER TUMOR (TURBT);  Surgeon: Alexis Frock, MD;  Location: [facility];  Service: Urology;  Laterality: N/A;   History reviewed. No pertinent family  history. Social History   Socioeconomic History   Marital status: Married    Spouse name: Glenda   Number of children: 1   Years of education: Not on file   Highest education level: Not on file  Occupational History   Not on file  Tobacco Use   Smoking status: Former    Years: 5.00    Types: Cigarettes    Quit date: 04/20/1963    Years since quitting: 58.3   Smokeless tobacco: Never  Vaping Use   Vaping Use: Never used  Substance and Sexual Activity   Alcohol use: No   Drug use: No   Sexual activity: Yes  Other Topics Concern   Not on file  Social History Narrative   1 daughter and 1 grandson   Social Determinants of Radio broadcast assistant Strain: Not on file  Food Insecurity: No Food Insecurity   Worried About Charity fundraiser in the Last Year: Never true   Arboriculturist in the Last Year: Never true  Transportation Needs: No Transportation Needs   Lack of Transportation (Medical): No   Lack of Transportation (Non-Medical): No  Physical Activity: Sufficiently Active   Days of Exercise per Week: 5 days   Minutes of Exercise per Session: 60 min  Stress: No Stress Concern Present   Feeling of Stress : Not at all  Social Connections: Socially Integrated   Frequency of Communication with Friends and Family: More than three times a week   Frequency of Social Gatherings with Friends and Family: More than three times a week   Attends Religious Services: More than 4 times per year   Active Member of [employer] or Organizations: Yes   Attends Music therapist: More than 4 times per year   Marital Status: Married    Tobacco Counseling Counseling given: Not Answered   Clinical Intake:  Pre-visit preparation completed: Yes  Pain : No/denies pain     BMI - recorded: 20.67 Nutritional Status: BMI of 19-24  Normal Nutritional Risks: None Diabetes: No  How often do you need to have someone help you when you read instructions, pamphlets, or other  written materials from your doctor or pharmacy?: 1 - Never  Diabetic?NO  Interpreter Needed?: No  Information entered by :: mj Vlasta Baskin, lpn   Activities of Daily Living In your present state of health, do you have any difficulty performing the following activities: 07/29/2021  Hearing? N  Vision? N  Difficulty concentrating or making decisions? N  Walking or climbing stairs? N  Dressing or bathing? N  Doing errands, shopping? N  Preparing Food and eating ? N  Using the Toilet? N  In the past six months, have you accidently leaked urine? N  Do you have problems with loss of bowel control? N  Managing your Medications? N  Managing your Finances? N  Housekeeping or managing your Housekeeping? N  Some recent data might be hidden    Patient Care Team: Claretta Fraise, MD as PCP - General (Family Medicine) Lavonna Monarch, MD as Consulting Physician (Dermatology)  Indicate any recent Medical Services you may have received from other than [facility]  providers in the past year (date may be approximate).     Assessment:   This is a routine wellness examination for Curtis Patel.  Hearing/Vision screen Hearing Screening - Comments:: Wears hearing aids. Vision Screening - Comments:: Readers. 2022.  Dietary issues and exercise activities discussed: Current Exercise Habits: Home exercise routine, Type of exercise: walking;strength training/weights, Time (Minutes): 60, Frequency (Times/Week): 5, Weekly Exercise (Minutes/Week): 300, Intensity: Moderate, Exercise limited by: cardiac condition(s)   Goals Addressed             This Visit's Progress    Exercise 3x per week (30 min per time)       Continue to exercise and stay healthy. Pt has plans to hike the [facility] this year and hike in [place].       Depression Screen PHQ 2/9 Scores 07/29/2021 06/25/2021 10/25/2019 08/17/2016 07/28/2016  PHQ - 2 Score 0 0 0 0 0    Fall Risk Fall Risk  07/29/2021 06/25/2021 10/25/2019 05/22/2019 05/15/2018   Falls in the past year? 0 0 0 0 0  Comment - - - Emmi Telephone Survey: data to providers prior to load [employer] Telephone Survey: data to providers prior to load  Number falls in past yr: 0 - - - -  Injury with Fall? 0 - - - -  Risk for fall due to : No Fall Risks - No Fall Risks - -  Follow up Falls prevention discussed - Falls evaluation completed - -    FALL RISK PREVENTION PERTAINING TO THE HOME:  Any stairs in or around the home? No  If so, are there any without handrails? No  Home free of loose throw rugs in walkways, pet beds, electrical cords, etc? Yes  Adequate lighting in your home to reduce risk of falls? Yes   ASSISTIVE DEVICES UTILIZED TO PREVENT FALLS:  Life alert? No  Use of a cane, walker or w/c? No  Grab bars in the bathroom? No  Shower chair or bench in shower? No  Elevated toilet seat or a handicapped toilet? No   TIMED UP AND GO:  Was the test performed? No .  Phone visit.  Cognitive Function:     6CIT Screen 07/29/2021  What Year? 0 points  What month? 0 points  What time? 0 points  Count back from 20 0 points  Months in reverse 0 points  Repeat phrase 0 points  Total Score 0    Immunizations Immunization History  Administered Date(s) Administered   Influenza-Unspecified 03/28/2018, 04/28/2021   Pneumococcal Polysaccharide-23 01/03/2009   Tdap 09/19/2013    TDAP status: Up to date  Flu Vaccine status: Up to date  Pneumococcal vaccine status: Due, Education has been provided regarding the importance of this vaccine. Advised may receive this vaccine at local pharmacy or Health Dept. Aware to provide a copy of the vaccination record if obtained from local pharmacy or Health Dept. Verbalized acceptance and understanding.  Covid-19 vaccine status: Information provided on how to obtain vaccines.   Qualifies for Shingles Vaccine? Yes   Zostavax completed No   Shingrix Completed?: No.    Education has been provided regarding the importance of this  vaccine. Patient has been advised to call insurance company to determine out of pocket expense if they have not yet received this vaccine. Advised may also receive vaccine at local pharmacy or Health Dept. Verbalized acceptance and understanding.  Screening Tests Health Maintenance  Topic Date Due   COVID-19 Vaccine (1) Never done   Zoster  Vaccines- Shingrix (1 of 2) Never done   Pneumonia Vaccine 63+ Years old (2 - PCV) 01/03/2010   TETANUS/TDAP  09/20/2023   INFLUENZA VACCINE  Completed   HPV VACCINES  Aged Out    Health Maintenance  Health Maintenance Due  Topic Date Due   COVID-19 Vaccine (1) Never done   Zoster Vaccines- Shingrix (1 of 2) Never done   Pneumonia Vaccine 41+ Years old (2 - PCV) 01/03/2010    Colorectal cancer screening: No longer required.   Lung Cancer Screening: (Low Dose CT Chest recommended if Age 70-80 years, 30 pack-year currently smoking OR have quit w/in 15years.) does not qualify.  Additional Screening:  Hepatitis C Screening: does not qualify.  Vision Screening: Recommended annual ophthalmology exams for early detection of glaucoma and other disorders of the eye. Is the patient up to date with their annual eye exam?  Yes  Who is the provider or what is the name of the office in which the patient attends annual eye exams? Opthalmologist in [place]. If pt is not established with a provider, would they like to be referred to a provider to establish care? No .   Dental Screening: Recommended annual dental exams for proper oral hygiene  Community Resource Referral / Chronic Care Management: CRR required this visit?  No   CCM required this visit?  No      Plan:     I have personally reviewed and noted the following in the patients chart:   Medical and social history Use of alcohol, tobacco or illicit drugs  Current medications and supplements including opioid prescriptions. Patient is not currently taking opioid prescriptions. Functional  ability and status Nutritional status Physical activity Advanced directives List of other physicians Hospitalizations, surgeries, and ER visits in previous 12 months Vitals Screenings to include cognitive, depression, and falls Referrals and appointments  In addition, I have reviewed and discussed with patient certain preventive protocols, quality metrics, and best practice recommendations. A written personalized care plan for preventive services as well as general preventive health recommendations were provided to patient.     Chriss Driver, LPN   12/30/1698   Nurse Notes: Discussed Covid, Shingrix and 2nd Pneumococcal vaccine and how to obtain. Pt has plans to re-hike the [facility], with his daughter, this year.

## 2021-12-02 ENCOUNTER — Ambulatory Visit (INDEPENDENT_AMBULATORY_CARE_PROVIDER_SITE_OTHER): Payer: Medicare Other | Admitting: Dermatology

## 2021-12-02 DIAGNOSIS — D045 Carcinoma in situ of skin of trunk: Secondary | ICD-10-CM | POA: Diagnosis not present

## 2021-12-02 DIAGNOSIS — L570 Actinic keratosis: Secondary | ICD-10-CM | POA: Diagnosis not present

## 2021-12-02 DIAGNOSIS — D044 Carcinoma in situ of skin of scalp and neck: Secondary | ICD-10-CM

## 2021-12-02 DIAGNOSIS — D485 Neoplasm of uncertain behavior of skin: Secondary | ICD-10-CM

## 2021-12-07 ENCOUNTER — Telehealth: Payer: Self-pay

## 2021-12-07 NOTE — Telephone Encounter (Signed)
Path to patient and surgery made 12/17/21

## 2021-12-07 NOTE — Telephone Encounter (Signed)
-----   Message from Lavonna Monarch, MD sent at 12/04/2021  6:17 PM EDT ----- Schedule surgery with Dr. Darene Lamer

## 2021-12-16 ENCOUNTER — Ambulatory Visit: Payer: Medicare Other | Admitting: Dermatology

## 2021-12-17 ENCOUNTER — Encounter: Payer: Self-pay | Admitting: Dermatology

## 2021-12-17 ENCOUNTER — Ambulatory Visit (INDEPENDENT_AMBULATORY_CARE_PROVIDER_SITE_OTHER): Payer: Medicare Other | Admitting: Dermatology

## 2021-12-17 DIAGNOSIS — D485 Neoplasm of uncertain behavior of skin: Secondary | ICD-10-CM

## 2021-12-17 DIAGNOSIS — L570 Actinic keratosis: Secondary | ICD-10-CM | POA: Diagnosis not present

## 2021-12-17 DIAGNOSIS — C44529 Squamous cell carcinoma of skin of other part of trunk: Secondary | ICD-10-CM | POA: Diagnosis not present

## 2021-12-17 DIAGNOSIS — D044 Carcinoma in situ of skin of scalp and neck: Secondary | ICD-10-CM

## 2021-12-23 ENCOUNTER — Encounter: Payer: Self-pay | Admitting: Dermatology

## 2021-12-23 NOTE — Progress Notes (Signed)
   Follow-Up Visit   Subjective  Curtis Patel is a 81 y.o. male who presents for the following: Skin Problem (Patient here today for lesions on his neck and his face that he would like checked. Per patient the lesion on the right side of his neck is bleeding. ).  Enlarging lesion right neck that bleeds, several crusts on face. Location:  Duration:  Quality:  Associated Signs/Symptoms: Modifying Factors:  Severity:  Timing: Context:   Objective  Well appearing patient in no apparent distress; mood and affect are within normal limits. Right Anterior Neck 1.1 cm eroded waxy crust       Left Temple (2), Right Forehead (2) Gritty 4 mm pink crusts    A focused examination was performed including head, neck, upper torso.. Relevant physical exam findings are noted in the Assessment and Plan.   Assessment & Plan    Neoplasm of uncertain behavior of skin Right Anterior Neck  Skin / nail biopsy Type of biopsy: tangential   Informed consent: discussed and consent obtained   Timeout: patient name, date of birth, surgical site, and procedure verified   Procedure prep:  Patient was prepped and draped in usual sterile fashion (Non sterile) Prep type:  Chlorhexidine Anesthesia: the lesion was anesthetized in a standard fashion   Anesthetic:  1% lidocaine w/ epinephrine 1-100,000 local infiltration Instrument used: flexible razor blade   Outcome: patient tolerated procedure well   Post-procedure details: wound care instructions given    Specimen 1 - Surgical pathology Differential Diagnosis: bcc vs scc  Check Margins: No  Shave biopsy, may be a candidate for Mohs surgery.  AK (actinic keratosis) (4) Left Temple (2); Right Forehead (2)  Destruction of lesion - Left Temple, Right Forehead Complexity: simple   Destruction method: cryotherapy   Informed consent: discussed and consent obtained   Timeout:  patient name, date of birth, surgical site, and procedure  verified Lesion destroyed using liquid nitrogen: Yes   Cryotherapy cycles:  3 Outcome: patient tolerated procedure well with no complications        I, Lavonna Monarch, MD, have reviewed all documentation for this visit.  The documentation on 12/23/21 for the exam, diagnosis, procedures, and orders are all accurate and complete.

## 2022-01-04 ENCOUNTER — Encounter: Payer: Self-pay | Admitting: Dermatology

## 2022-01-04 NOTE — Progress Notes (Signed)
Follow-Up Visit   Subjective  Curtis Patel is a 81 y.o. male who presents for the following: Procedure (Patient here today for treatment of CIS x 1 right anterior neck).  Biopsy proven carcinoma in situ neck plus growing crusts on chest Location:  Duration:  Quality:  Associated Signs/Symptoms: Modifying Factors:  Severity:  Timing: Context:   Objective  Well appearing patient in no apparent distress; mood and affect are within normal limits. Right Anterior Neck Lesion identified by Dr.Chi Garlow and nurse in room.    Chest (Upper Torso, Anterior) (4) Four hornlike 2 to 3 mm pink crusts  Right chest 8 mm waxy crust with focal erosion       All skin waist up examined.   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 Right Anterior Neck  Destruction of lesion Complexity: simple   Destruction method: electrodesiccation and curettage   Informed consent: discussed and consent obtained   Timeout:  patient name, date of birth, surgical site, and procedure verified Anesthesia: the lesion was anesthetized in a standard fashion   Anesthetic:  1% lidocaine w/ epinephrine 1-100,000 local infiltration Curettage performed in three different directions: Yes   Electrodesiccation performed over the curetted area: Yes   Curettage cycles:  3 Lesion length (cm):  2 Lesion width (cm):  2 Margin per side (cm):  0 Final wound size (cm):  2 Hemostasis achieved with:  aluminum chloride Outcome: patient tolerated procedure well with no complications   Post-procedure details: wound care instructions given    AK (actinic keratosis) (4) Chest (Upper Torso, Anterior)  Destruction of lesion - Chest (Upper Torso, Anterior) Complexity: simple   Destruction method: cryotherapy   Informed consent: discussed and consent obtained   Timeout:  patient name, date of birth, surgical site, and procedure verified Lesion destroyed using liquid nitrogen: Yes    Cryotherapy cycles:  3 Outcome: patient tolerated procedure well with no complications    Squamous cell carcinoma of skin of chest Right chest  Skin / nail biopsy Type of biopsy: tangential   Informed consent: discussed and consent obtained   Timeout: patient name, date of birth, surgical site, and procedure verified   Anesthesia: the lesion was anesthetized in a standard fashion   Anesthetic:  1% lidocaine w/ epinephrine 1-100,000 local infiltration Instrument used: flexible razor blade   Hemostasis achieved with: aluminum chloride and electrodesiccation   Outcome: patient tolerated procedure well   Post-procedure details: wound care instructions given    Destruction of lesion Complexity: simple   Destruction method: electrodesiccation and curettage   Informed consent: discussed and consent obtained   Timeout:  patient name, date of birth, surgical site, and procedure verified Anesthesia: the lesion was anesthetized in a standard fashion   Anesthetic:  1% lidocaine w/ epinephrine 1-100,000 local infiltration Curettage performed in three different directions: Yes   Electrodesiccation performed over the curetted area: Yes   Curettage cycles:  3 Lesion length (cm):  1 Lesion width (cm):  1 Margin per side (cm):  0 Final wound size (cm):  1 Hemostasis achieved with:  aluminum chloride Outcome: patient tolerated procedure well with no complications   Post-procedure details: wound care instructions given    Specimen 1 - Surgical pathology Differential Diagnosis: bcc scc tx with bx  Check Margins: No  After shave biopsy the base of the lesion was treated with curettage plus cautery      I, Lavonna Monarch, MD, have reviewed all  documentation for this visit.  The documentation on 01/04/22 for the exam, diagnosis, procedures, and orders are all accurate and complete.

## 2022-01-30 ENCOUNTER — Other Ambulatory Visit: Payer: Self-pay | Admitting: Family Medicine

## 2022-02-13 ENCOUNTER — Other Ambulatory Visit: Payer: Self-pay | Admitting: Family Medicine

## 2022-02-18 DIAGNOSIS — H2511 Age-related nuclear cataract, right eye: Secondary | ICD-10-CM | POA: Diagnosis not present

## 2022-02-18 DIAGNOSIS — H524 Presbyopia: Secondary | ICD-10-CM | POA: Diagnosis not present

## 2022-02-18 DIAGNOSIS — Z961 Presence of intraocular lens: Secondary | ICD-10-CM | POA: Diagnosis not present

## 2022-03-29 DIAGNOSIS — Z23 Encounter for immunization: Secondary | ICD-10-CM | POA: Diagnosis not present

## 2022-07-29 ENCOUNTER — Ambulatory Visit (INDEPENDENT_AMBULATORY_CARE_PROVIDER_SITE_OTHER): Payer: Medicare Other

## 2022-07-29 VITALS — Ht 67.0 in | Wt 132.0 lb

## 2022-07-29 DIAGNOSIS — Z0000 Encounter for general adult medical examination without abnormal findings: Secondary | ICD-10-CM

## 2022-07-29 NOTE — Patient Instructions (Signed)
Curtis Patel , Thank you for taking time to come for your Medicare Wellness Visit. I appreciate your ongoing commitment to your health goals. Please review the following plan we discussed and let me know if I can assist you in the future.   These are the goals we discussed:  Goals      Exercise 3x per week (30 min per time)     Continue to exercise and stay healthy. Pt has plans to hike the [facility] this year and hike in [place].        This is a list of the screening recommended for you and due dates:  Health Maintenance  Topic Date Due   COVID-19 Vaccine (1) Never done   Zoster (Shingles) Vaccine (1 of 2) Never done   Pneumonia Vaccine (2 - PCV) 01/03/2010   Flu Shot  01/26/2022   Medicare Annual Wellness Visit  07/30/2023   DTaP/Tdap/Td vaccine (2 - Td or Tdap) 09/20/2023   HPV Vaccine  Aged Out    Advanced directives: Please bring a copy of your health care power of attorney and living will to the office to be added to your chart at your convenience.   Conditions/risks identified: Aim for 30 minutes of exercise or brisk walking, 6-8 glasses of water, and 5 servings of fruits and vegetables each day.   Next appointment: Follow up in one year for your annual wellness visit.   Preventive Care 22 Years and Older, Male  Preventive care refers to lifestyle choices and visits with your health care provider that can promote health and wellness. What does preventive care include? A yearly physical exam. This is also called an annual well check. Dental exams once or twice a year. Routine eye exams. Ask your health care provider how often you should have your eyes checked. Personal lifestyle choices, including: Daily care of your teeth and gums. Regular physical activity. Eating a healthy diet. Avoiding tobacco and drug use. Limiting alcohol use. Practicing safe sex. Taking low doses of aspirin every day. Taking vitamin and mineral supplements as recommended by your health care  provider. What happens during an annual well check? The services and screenings done by your health care provider during your annual well check will depend on your age, overall health, lifestyle risk factors, and family history of disease. Counseling  Your health care provider may ask you questions about your: Alcohol use. Tobacco use. Drug use. Emotional well-being. Home and relationship well-being. Sexual activity. Eating habits. History of falls. Memory and ability to understand (cognition). Work and work Statistician. Screening  You may have the following tests or measurements: Height, weight, and BMI. Blood pressure. Lipid and cholesterol levels. These may be checked every 5 years, or more frequently if you are over 51 years old. Skin check. Lung cancer screening. You may have this screening every year starting at age 12 if you have a 30-pack-year history of smoking and currently smoke or have quit within the past 15 years. Fecal occult blood test (FOBT) of the stool. You may have this test every year starting at age 46. Flexible sigmoidoscopy or colonoscopy. You may have a sigmoidoscopy every 5 years or a colonoscopy every 10 years starting at age 65. Prostate cancer screening. Recommendations will vary depending on your family history and other risks. Hepatitis C blood test. Hepatitis B blood test. Sexually transmitted disease (STD) testing. Diabetes screening. This is done by checking your blood sugar (glucose) after you have not eaten for a while (fasting).  You may have this done every 1-3 years. Abdominal aortic aneurysm (AAA) screening. You may need this if you are a current or former smoker. Osteoporosis. You may be screened starting at age 8 if you are at high risk. Talk with your health care provider about your test results, treatment options, and if necessary, the need for more tests. Vaccines  Your health care provider may recommend certain vaccines, such  as: Influenza vaccine. This is recommended every year. Tetanus, diphtheria, and acellular pertussis (Tdap, Td) vaccine. You may need a Td booster every 10 years. Zoster vaccine. You may need this after age 6. Pneumococcal 13-valent conjugate (PCV13) vaccine. One dose is recommended after age 62. Pneumococcal polysaccharide (PPSV23) vaccine. One dose is recommended after age 24. Talk to your health care provider about which screenings and vaccines you need and how often you need them. This information is not intended to replace advice given to you by your health care provider. Make sure you discuss any questions you have with your health care provider. Document Released: 07/11/2015 Document Revised: 03/03/2016 Document Reviewed: 04/15/2015 Elsevier Interactive Patient Education  2017 [employer] Prevention in the Home Falls can cause injuries. They can happen to people of all ages. There are many things you can do to make your home safe and to help prevent falls. What can I do on the outside of my home? Regularly fix the edges of walkways and driveways and fix any cracks. Remove anything that might make you trip as you walk through a door, such as a raised step or threshold. Trim any bushes or trees on the path to your home. Use bright outdoor lighting. Clear any walking paths of anything that might make someone trip, such as rocks or tools. Regularly check to see if handrails are loose or broken. Make sure that both sides of any steps have handrails. Any raised decks and porches should have guardrails on the edges. Have any leaves, snow, or ice cleared regularly. Use sand or salt on walking paths during winter. Clean up any spills in your garage right away. This includes oil or grease spills. What can I do in the bathroom? Use night lights. Install grab bars by the toilet and in the tub and shower. Do not use towel bars as grab bars. Use non-skid mats or decals in the tub or  shower. If you need to sit down in the shower, use a plastic, non-slip stool. Keep the floor dry. Clean up any water that spills on the floor as soon as it happens. Remove soap buildup in the tub or shower regularly. Attach bath mats securely with double-sided non-slip rug tape. Do not have throw rugs and other things on the floor that can make you trip. What can I do in the bedroom? Use night lights. Make sure that you have a light by your bed that is easy to reach. Do not use any sheets or blankets that are too big for your bed. They should not hang down onto the floor. Have a firm chair that has side arms. You can use this for support while you get dressed. Do not have throw rugs and other things on the floor that can make you trip. What can I do in the kitchen? Clean up any spills right away. Avoid walking on wet floors. Keep items that you use a lot in easy-to-reach places. If you need to reach something above you, use a strong step stool that has a grab bar. Keep  electrical cords out of the way. Do not use floor polish or wax that makes floors slippery. If you must use wax, use non-skid floor wax. Do not have throw rugs and other things on the floor that can make you trip. What can I do with my stairs? Do not leave any items on the stairs. Make sure that there are handrails on both sides of the stairs and use them. Fix handrails that are broken or loose. Make sure that handrails are as long as the stairways. Check any carpeting to make sure that it is firmly attached to the stairs. Fix any carpet that is loose or worn. Avoid having throw rugs at the top or bottom of the stairs. If you do have throw rugs, attach them to the floor with carpet tape. Make sure that you have a light switch at the top of the stairs and the bottom of the stairs. If you do not have them, ask someone to add them for you. What else can I do to help prevent falls? Wear shoes that: Do not have high heels. Have  rubber bottoms. Are comfortable and fit you well. Are closed at the toe. Do not wear sandals. If you use a stepladder: Make sure that it is fully opened. Do not climb a closed stepladder. Make sure that both sides of the stepladder are locked into place. Ask someone to hold it for you, if possible. Clearly mark and make sure that you can see: Any grab bars or handrails. First and last steps. Where the edge of each step is. Use tools that help you move around (mobility aids) if they are needed. These include: Canes. Walkers. Scooters. Crutches. Turn on the lights when you go into a dark area. Replace any light bulbs as soon as they burn out. Set up your furniture so you have a clear path. Avoid moving your furniture around. If any of your floors are uneven, fix them. If there are any pets around you, be aware of where they are. Review your medicines with your doctor. Some medicines can make you feel dizzy. This can increase your chance of falling. Ask your doctor what other things that you can do to help prevent falls. This information is not intended to replace advice given to you by your health care provider. Make sure you discuss any questions you have with your health care provider. Document Released: 04/10/2009 Document Revised: 11/20/2015 Document Reviewed: 07/19/2014 Elsevier Interactive Patient Education  2017 [employer].

## 2022-07-29 NOTE — Progress Notes (Signed)
Subjective:   Curtis Patel is a 82 y.o. male who presents for Medicare Annual/Subsequent preventive examination. I connected with  Curtis Patel on 07/29/22 by a audio enabled telemedicine application and verified that I am speaking with the correct person using two identifiers.  Patient Location: Home  Provider Location: Home Office  I discussed the limitations of evaluation and management by telemedicine. The patient expressed understanding and agreed to proceed.  Review of Systems     Cardiac Risk Factors include: advanced age (>81mn, >>74women);male gender;dyslipidemia     Objective:    Today's Vitals   07/29/22 1021  Weight: 132 lb (59.9 kg)  Height: '5\' 7"'$  (1.702 m)   Body mass index is 20.67 kg/m.     07/29/2022   10:25 AM 07/29/2021    9:58 AM 04/27/2017    8:30 AM 03/01/2017    8:08 AM  Advanced Directives  Does Patient Have a Medical Advance Directive? Yes No Yes Yes  Type of AParamedicof [place]lowing [place] will  HMount CalvaryLiving will HOs[place]in Chart? No - copy requested  No - copy requested No - copy requested  Would patient like information on creating a medical advance directive?  No - Patient declined      Current Medications (verified) Outpatient Encounter Medications as of 07/29/2022  Medication Sig   rosuvastatin (CRESTOR) 10 MG tablet Take 1 tablet (10 mg total) by mouth daily. (NEEDS TO BE SEEN BEFORE NEXT REFILL)   No facility-administered encounter medications on file as of 07/29/2022.    Allergies (verified) Patient has no known allergies.   History: Past Medical History:  Diagnosis Date   Atypical mole 09/15/2010   malignant spindle cell--right nose   Basal cell carcinoma 09/15/2010   left chest curet tx 3   Bladder cancer ([facility] 03/11/2017   Frequency of urination    History of basal cell carcinoma (BCC) of skin    09-15-2010  left face    History of exercise stress test 08/26/2016   Normal echo stress w/ normal LVSF   History of malignant neoplasm of skin    09-15-2010   s/p excision spindle cell neoplasm right nose   Nocturia    Squamous cell carcinoma of skin 04/02/2019   scc in situ--left side nose--cx3, 563f  Wears dentures    upper   Past Surgical History:  Procedure Laterality Date   CATARACT EXTRACTION Left 2000   per pt no lens implant   CYSTOSCOPY W/ URETERAL STENT PLACEMENT Bilateral 03/09/2017   Procedure: CYSTOSCOPY WITH BILATERAL RETROGRADE PYELOGRAM/ LEFT URETERAL STENT PLACEMENT;  Surgeon: MaAlexis FrockMD;  Location: [facility];  Service: Urology;  Laterality: Bilateral;   CYSTOSCOPY W/ URETERAL STENT PLACEMENT Bilateral 04/27/2017   Procedure: CYSTOSCOPY WITH RETROGRADE PYELOGRAM/URETERAL LEFT STENT REPLACEMENT;  Surgeon: MaAlexis FrockMD;  [facility];  Laterality: Bilateral;   DUPUYTREN CONTRACTURE RELEASE Left 09-22-2010   [facility]   TRANSURETHRAL RESECTION OF BLADDER TUMOR N/A 03/09/2017   Procedure: TRANSURETHRAL RESECTION OF BLADDER TUMOR (TURBT);  Surgeon: MaAlexis FrockMD;  Location: [facility];  Service: Urology;  Laterality: N/A;   TRANSURETHRAL RESECTION OF BLADDER TUMOR N/A 04/27/2017   Procedure: TRANSURETHRAL RESECTION OF BLADDER TUMOR (TURBT);  Surgeon: MaAlexis FrockMD;  [facility];  Laterality: N/A;   History  reviewed. No pertinent family history. Social History   Socioeconomic History   Marital status: Married    Spouse name: Curtis Patel   Number of children: 1   Years of education: Not on file   Highest education level: Not on file  Occupational History   Not on file  Tobacco Use   Smoking status: Former    Years: 5.00    Types: Cigarettes    Quit date: 04/20/1963    Years since quitting: 59.3   Smokeless tobacco: Never  Vaping Use   Vaping Use: Never  used  Substance and Sexual Activity   Alcohol use: No   Drug use: No   Sexual activity: Yes  Other Topics Concern   Not on file  Social History Narrative   1 daughter and 1 grandson   Social Determinants of Health   Financial Resource Strain: Low Risk  (07/29/2022)   Overall Financial Resource Strain (CARDIA)    Difficulty of Paying Living Expenses: Not hard at all  Food Insecurity: No Food Insecurity (07/29/2022)   Hunger Vital Sign    Worried About Running Out of Food in the Last Year: Never true    [place] in the Last Year: Never true  Transportation Needs: No Transportation Needs (07/29/2021)   PRAPARE - Hydrologist (Medical): No    Lack of Transportation (Non-Medical): No  Physical Activity: Sufficiently Active (07/29/2022)   Exercise Vital Sign    Days of Exercise per Week: 5 days    Minutes of Exercise per Session: 30 min  Stress: No Stress Concern Present (07/29/2022)   [employer] [place] of Stress : Not at all  Social Connections: [place] (07/29/2022)   Social Connection and Isolation Panel [NHANES]    Frequency of Communication with Friends and Family: More than three times a week    Frequency of Social Gatherings with Friends and Family: More than three times a week    Attends Religious Services: More than 4 times per year    Active Member of [employer] or Organizations: Yes    Attends Music therapist: More than 4 times per year    Marital Status: Married    Tobacco Counseling Counseling given: Not Answered   Clinical Intake:  Pre-visit preparation completed: Yes  Pain : No/denies pain     Nutritional Risks: None Diabetes: No  How often do you need to have someone help you when you read instructions, pamphlets, or other written materials from your doctor or pharmacy?: 1 - Never  Diabetic?no   Interpreter Needed?: No  Information  entered by :: Curtis Pierini, LPN   Activities of Daily Living    07/29/2022   10:24 AM  In your present state of health, do you have any difficulty performing the following activities:  Hearing? 0  Vision? 0  Difficulty concentrating or making decisions? 0  Walking or climbing stairs? 0  Dressing or bathing? 0  Doing errands, shopping? 0  Preparing Food and eating ? N  Using the Toilet? N  In the past six months, have you accidently leaked urine? N  Do you have problems with loss of bowel control? N  Managing your Medications? N  Managing your Finances? N  Housekeeping or managing your Housekeeping? N    Patient Care Team: Claretta Fraise, MD as PCP - General (Family Medicine) Lavonna Monarch, MD (Inactive) as Consulting Physician (Dermatology)  Indicate  any recent Medical Services you may have received from other than [facility] providers in the past year (date may be approximate).     Assessment:   This is a routine wellness examination for [place].  Hearing/Vision screen Vision Screening - Comments:: Wears rx glasses - up to date with routine eye exams with  [facility]   Dietary issues and exercise activities discussed: Current Exercise Habits: Home exercise routine, Type of exercise: walking, Time (Minutes): 30, Frequency (Times/Week): 5, Weekly Exercise (Minutes/Week): 150, Intensity: Mild, Exercise limited by: None identified   Goals Addressed             This Visit's Progress    Exercise 3x per week (30 min per time)   On track    Continue to exercise and stay healthy. Pt has plans to hike the [facility] this year and hike in [place].       Depression Screen    07/29/2021    9:52 AM 06/25/2021    9:17 AM 10/25/2019   10:59 AM 08/17/2016   10:47 AM 07/28/2016    1:13 PM  PHQ 2/9 Scores  PHQ - 2 Score 0 0 0 0 0    Fall Risk    07/29/2022   10:22 AM 07/29/2021    9:59 AM 06/25/2021    9:17 AM 10/25/2019   10:58 AM 05/22/2019    4:20 PM  Fall Risk    Falls in the past year? 0 0 0 0 0  Comment     Emmi Telephone Survey: data to providers prior to load  Number falls in past yr: 0 0     Injury with Fall? 0 0     Risk for fall due to : No Fall Risks No Fall Risks  No Fall Risks   Follow up Falls prevention discussed Falls prevention discussed  Falls evaluation completed     [place]:  Any stairs in or around the home? No  If so, are there any without handrails? No  Home free of loose throw rugs in walkways, pet beds, electrical cords, etc? Yes  Adequate lighting in your home to reduce risk of falls? Yes   ASSISTIVE DEVICES UTILIZED TO PREVENT FALLS:  Life alert? No  Use of a cane, walker or w/c? No  Grab bars in the bathroom? Yes  Shower chair or bench in shower? Yes  Elevated toilet seat or a handicapped toilet? Yes          07/29/2022   10:25 AM 07/29/2021   10:02 AM  6CIT Screen  What Year? 0 points 0 points  What month? 0 points 0 points  What time? 0 points 0 points  Count back from 20 0 points 0 points  Months in reverse 0 points 0 points  Repeat phrase 0 points 0 points  Total Score 0 points 0 points    Immunizations Immunization History  Administered Date(s) Administered   Influenza-Unspecified 03/28/2018, 04/28/2021   Pneumococcal Polysaccharide-23 01/03/2009   Tdap 09/19/2013    TDAP status: Up to date  Flu Vaccine status: Up to date  Pneumococcal vaccine status: Up to date  Covid-19 vaccine status: Completed vaccines  Qualifies for Shingles Vaccine? Yes   Zostavax completed No   Shingrix Completed?: No.    Education has been provided regarding the importance of this vaccine. Patient has been advised to call insurance company to determine out of pocket expense if they have not yet received this vaccine. Advised  may also receive vaccine at local pharmacy or Health Dept. Verbalized acceptance and understanding.  Screening Tests Health Maintenance  Topic Date Due    COVID-19 Vaccine (1) Never done   Zoster Vaccines- Shingrix (1 of 2) Never done   Pneumonia Vaccine 23+ Years old (2 - PCV) 01/03/2010   INFLUENZA VACCINE  01/26/2022   Medicare Annual Wellness (AWV)  07/30/2023   DTaP/Tdap/Td (2 - Td or Tdap) 09/20/2023   HPV VACCINES  Aged Out    Health Maintenance  Health Maintenance Due  Topic Date Due   COVID-19 Vaccine (1) Never done   Zoster Vaccines- Shingrix (1 of 2) Never done   Pneumonia Vaccine 61+ Years old (2 - PCV) 01/03/2010   INFLUENZA VACCINE  01/26/2022    Colorectal cancer screening: No longer required.   Lung Cancer Screening: (Low Dose CT Chest recommended if Age 63-80 years, 30 pack-year currently smoking OR have quit w/in 15years.) does not qualify.   Lung Cancer Screening Referral: n/a  Additional Screening:  Hepatitis C Screening: does not qualify;   Vision Screening: Recommended annual ophthalmology exams for early detection of glaucoma and other disorders of the eye. Is the patient up to date with their annual eye exam?  Yes  Who is the provider or what is the name of the office in which the patient attends annual eye exams? [facility]  If pt is not established with a provider, would they like to be referred to a provider to establish care? No .   Dental Screening: Recommended annual dental exams for proper oral hygiene  Community Resource Referral / Chronic Care Management: CRR required this visit?  No   CCM required this visit?  No      Plan:     I have personally reviewed and noted the following in the patient's chart:   Medical and social history Use of alcohol, tobacco or illicit drugs  Current medications and supplements including opioid prescriptions. Patient is not currently taking opioid prescriptions. Functional ability and status Nutritional status Physical activity Advanced directives List of other physicians Hospitalizations, surgeries, and ER visits in previous 12  months Vitals Screenings to include cognitive, depression, and falls Referrals and appointments  In addition, I have reviewed and discussed with patient certain preventive protocols, quality metrics, and best practice recommendations. A written personalized care plan for preventive services as well as general preventive health recommendations were provided to patient.     Daphane Shepherd, LPN   02/28/8100   Nurse Notes: none

## 2022-08-02 NOTE — Progress Notes (Signed)
Subjective:   Curtis Patel is a 82 y.o. male who presents for Medicare Annual/Subsequent preventive examination. I connected with  [name] on 08/02/22 by a audio enabled telemedicine application and verified that I am speaking with the correct person using two identifiers.  Patient Location: Home  Provider Location: Home Office  I discussed the limitations of evaluation and management by telemedicine. The patient expressed understanding and agreed to proceed.  Review of Systems     Cardiac Risk Factors include: advanced age (>73mn, >>49women);male gender;dyslipidemia     Objective:    Today's Vitals   07/29/22 1021  Weight: 132 lb (59.9 kg)  Height: '5\' 7"'$  (1.702 m)   Body mass index is 20.67 kg/m.     07/29/2022   10:25 AM 07/29/2021    9:58 AM 04/27/2017    8:30 AM 03/01/2017    8:08 AM  Advanced Directives  Does Patient Have a Medical Advance Directive? Yes No Yes Yes  Type of [place]amed[place]of [place]  [place]? No - copy requested  No - copy requested No - copy requested  Would patient like information on creating a medical advance directive?  No - Patient declined      Current Medications (verified) Outpatient Encounter Medications as of 07/29/2022  Medication Sig   rosuvastatin (CRESTOR) 10 MG tablet Take 1 tablet (10 mg total) by mouth daily. (NEEDS TO BE SEEN BEFORE NEXT REFILL)   No facility-administered encounter medications on file as of 07/29/2022.    Allergies (verified) Patient has no known allergies.   History: Past Medical History:  Diagnosis Date   Atypical mole 09/15/2010   malignant spindle cell--right nose   Basal cell carcinoma 09/15/2010   left chest curet tx 3   Bladder cancer ([place] 03/11/2017   Frequency of urination    History of basal cell carcinoma (BCC) of skin    09-15-2010  left face    History of exercise stress test 08/26/2016   Normal echo stress w/ normal LVSF   History of malignant neoplasm of skin    09-15-2010   s/p excision spindle cell neoplasm right nose   Nocturia    Squamous cell carcinoma of skin 04/02/2019   scc in situ--left side nose--cx3, 532f  Wears dentures    upper   Past Surgical History:  Procedure Laterality Date   CATARACT EXTRACTION Left 2000   per pt no lens implant   CYSTOSCOPY W/ URETERAL STENT PLACEMENT Bilateral 03/09/2017   Procedure: CYSTOSCOPY WITH BILATERAL RETROGRADE PYELOGRAM/ LEFT URETERAL STENT PLACEMENT;  Surgeon: MaAlexis FrockMD;  Location: [facility];  Service: Urology;  Laterality: Bilateral;   CYSTOSCOPY W/ URETERAL STENT PLACEMENT Bilateral 04/27/2017   Procedure: CYSTOSCOPY WITH RETROGRADE PYELOGRAM/URETERAL LEFT STENT REPLACEMENT;  Surgeon: MaAlexis FrockMD;  [facility];  Laterality: Bilateral;   DUPUYTREN CONTRACTURE RELEASE Left 09-22-2010   [facility]   TRANSURETHRAL RESECTION OF BLADDER TUMOR N/A 03/09/2017   Procedure: TRANSURETHRAL RESECTION OF BLADDER TUMOR (TURBT);  Surgeon: MaAlexis FrockMD;  Location: [facility];  Service: Urology;  Laterality: N/A;   TRANSURETHRAL RESECTION OF BLADDER TUMOR N/A 04/27/2017   Procedure: TRANSURETHRAL RESECTION OF BLADDER TUMOR (TURBT);  Surgeon: MaAlexis FrockMD;  [facility];  Laterality: N/A;   History  reviewed. No pertinent family history. Social History   Socioeconomic History   Marital status: Married    Spouse name: Glenda   Number of children: 1   Years of education: Not on file   Highest education level: Not on file  Occupational History   Not on file  Tobacco Use   Smoking status: Former    Years: 5.00    Types: Cigarettes    Quit date: 04/20/1963    Years since quitting: 59.3   Smokeless tobacco: Never  Vaping Use   Vaping Use: Never  used  Substance and Sexual Activity   Alcohol use: No   Drug use: No   Sexual activity: Yes  Other Topics Concern   Not on file  Social History Narrative   1 daughter and 1 grandson   Social Determinants of Health   Financial Resource Strain: Low Risk  (07/29/2022)   Overall Financial Resource Strain (CARDIA)    Difficulty of Paying Living Expenses: Not hard at all  Food Insecurity: No Food Insecurity (07/29/2022)   Hunger Vital Sign    Worried About Running Out of Food in the Last Year: Never true    [place] in the Last Year: Never true  Transportation Needs: No Transportation Needs (07/29/2021)   PRAPARE - Hydrologist (Medical): No    Lack of Transportation (Non-Medical): No  Physical Activity: Sufficiently Active (07/29/2022)   Exercise Vital Sign    Days of Exercise per Week: 5 days    Minutes of Exercise per Session: 30 min  Stress: No Stress Concern Present (07/29/2022)   [employer] of Stress : Not at all  Social Connections: [place] (07/29/2022)   Social Connection and Isolation Panel [NHANES]    Frequency of Communication with Friends and Family: More than three times a week    Frequency of Social Gatherings with Friends and Family: More than three times a week    Attends Religious Services: More than 4 times per year    Active Member of [employer] or Organizations: Yes    Attends Music therapist: More than 4 times per year    Marital Status: Married    Tobacco Counseling Counseling given: Not Answered   Clinical Intake:  Pre-visit preparation completed: Yes  Pain : No/denies pain     Nutritional Risks: None Diabetes: No  How often do you need to have someone help you when you read instructions, pamphlets, or other written materials from your doctor or pharmacy?: 1 - Never  Diabetic?no   Interpreter Needed?: No  Information  entered by :: Jadene Pierini, LPN   Activities of Daily Living    07/29/2022   10:24 AM  In your present state of health, do you have any difficulty performing the following activities:  Hearing? 0  Vision? 0  Difficulty concentrating or making decisions? 0  Walking or climbing stairs? 0  Dressing or bathing? 0  Doing errands, shopping? 0  Preparing Food and eating ? N  Using the Toilet? N  In the past six months, have you accidently leaked urine? N  Do you have problems with loss of bowel control? N  Managing your Medications? N  Managing your Finances? N  Housekeeping or managing your Housekeeping? N    Patient Care Team: Claretta Fraise, MD as PCP - General (Family Medicine) Lavonna Monarch, MD (Inactive) as Consulting Physician (Dermatology)  Indicate  any recent Medical Services you may have received from other than [facility] providers in the past year (date may be approximate).     Assessment:   This is a routine wellness examination for [place].  Hearing/Vision screen Vision Screening - Comments:: Wears rx glasses - up to date with routine eye exams with  [facility]   Dietary issues and exercise activities discussed: Current Exercise Habits: Home exercise routine, Type of exercise: walking, Time (Minutes): 30, Frequency (Times/Week): 5, Weekly Exercise (Minutes/Week): 150, Intensity: Mild, Exercise limited by: None identified   Goals Addressed             This Visit's Progress    Exercise 3x per week (30 min per time)   On track    Continue to exercise and stay healthy. Pt has plans to hike the [facility] this year and hike in [place].      Depression Screen    07/29/2021    9:52 AM 06/25/2021    9:17 AM 10/25/2019   10:59 AM 08/17/2016   10:47 AM 07/28/2016    1:13 PM  PHQ 2/9 Scores  PHQ - 2 Score 0 0 0 0 0    Fall Risk    07/29/2022   10:22 AM 07/29/2021    9:59 AM 06/25/2021    9:17 AM 10/25/2019   10:58 AM 05/22/2019    4:20 PM  Fall Risk    Falls in the past year? 0 0 0 0 0  Comment     Emmi Telephone Survey: data to providers prior to load  Number falls in past yr: 0 0     Injury with Fall? 0 0     Risk for fall due to : No Fall Risks No Fall Risks  No Fall Risks   Follow up Falls prevention discussed Falls prevention discussed  Falls evaluation completed     [place]:  Any stairs in or around the home? No  If so, are there any without handrails? No  Home free of loose throw rugs in walkways, pet beds, electrical cords, etc? Yes  Adequate lighting in your home to reduce risk of falls? Yes   ASSISTIVE DEVICES UTILIZED TO PREVENT FALLS:  Life alert? No  Use of a cane, walker or w/c? No  Grab bars in the bathroom? Yes  Shower chair or bench in shower? Yes  Elevated toilet seat or a handicapped toilet? Yes          07/29/2022   10:25 AM 07/29/2021   10:02 AM  6CIT Screen  What Year? 0 points 0 points  What month? 0 points 0 points  What time? 0 points 0 points  Count back from 20 0 points 0 points  Months in reverse 0 points 0 points  Repeat phrase 0 points 0 points  Total Score 0 points 0 points    Immunizations Immunization History  Administered Date(s) Administered   Influenza-Unspecified 03/28/2018, 04/28/2021   Pneumococcal Polysaccharide-23 01/03/2009   Tdap 09/19/2013    TDAP status: Up to date  Flu Vaccine status: Up to date  Pneumococcal vaccine status: Up to date  Covid-19 vaccine status: Completed vaccines  Qualifies for Shingles Vaccine? Yes   Zostavax completed No   Shingrix Completed?: No.    Education has been provided regarding the importance of this vaccine. Patient has been advised to call insurance company to determine out of pocket expense if they have not yet received this vaccine. Advised may  also receive vaccine at local pharmacy or Health Dept. Verbalized acceptance and understanding.  Screening Tests Health Maintenance  Topic Date Due    COVID-19 Vaccine (1) Never done   Zoster Vaccines- Shingrix (1 of 2) Never done   Pneumonia Vaccine 22+ Years old (2 - PCV) 01/03/2010   INFLUENZA VACCINE  01/26/2022   Medicare Annual Wellness (AWV)  07/30/2023   DTaP/Tdap/Td (2 - Td or Tdap) 09/20/2023   HPV VACCINES  Aged Out    Health Maintenance  Health Maintenance Due  Topic Date Due   COVID-19 Vaccine (1) Never done   Zoster Vaccines- Shingrix (1 of 2) Never done   Pneumonia Vaccine 68+ Years old (2 - PCV) 01/03/2010   INFLUENZA VACCINE  01/26/2022    Colorectal cancer screening: No longer required.   Lung Cancer Screening: (Low Dose CT Chest recommended if Age 31-80 years, 30 pack-year currently smoking OR have quit w/in 15years.) does not qualify.   Lung Cancer Screening Referral: n/a  Additional Screening:  Hepatitis C Screening: does not qualify;   Vision Screening: Recommended annual ophthalmology exams for early detection of glaucoma and other disorders of the eye. Is the patient up to date with their annual eye exam?  Yes  Who is the provider or what is the name of the office in which the patient attends annual eye exams? [facility]  If pt is not established with a provider, would they like to be referred to a provider to establish care? No .   Dental Screening: Recommended annual dental exams for proper oral hygiene  Community Resource Referral / Chronic Care Management: CRR required this visit?  No   CCM required this visit?  No      Plan:     I have personally reviewed and noted the following in the patient's chart:   Medical and social history Use of alcohol, tobacco or illicit drugs  Current medications and supplements including opioid prescriptions. Patient is not currently taking opioid prescriptions. Functional ability and status Nutritional status Physical activity Advanced directives List of other physicians Hospitalizations, surgeries, and ER visits in previous 12  months Vitals Screenings to include cognitive, depression, and falls Referrals and appointments  In addition, I have reviewed and discussed with patient certain preventive protocols, quality metrics, and best practice recommendations. A written personalized care plan for preventive services as well as general preventive health recommendations were provided to patient.  I have reviewed and agree with the above AWV documentation.  Claretta Fraise, M.D.    Claretta Fraise, MD   08/02/2022   Nurse Notes: none

## 2022-08-20 ENCOUNTER — Other Ambulatory Visit: Payer: Self-pay | Admitting: Family Medicine

## 2023-08-11 ENCOUNTER — Ambulatory Visit (INDEPENDENT_AMBULATORY_CARE_PROVIDER_SITE_OTHER): Payer: Medicare Other

## 2023-08-11 VITALS — Ht 67.0 in | Wt 132.0 lb

## 2023-08-11 DIAGNOSIS — Z0000 Encounter for general adult medical examination without abnormal findings: Secondary | ICD-10-CM

## 2023-08-11 DIAGNOSIS — Z1211 Encounter for screening for malignant neoplasm of colon: Secondary | ICD-10-CM

## 2023-08-11 NOTE — Patient Instructions (Signed)
Curtis Patel , Thank you for taking time to come for your Medicare Wellness Visit. I appreciate your ongoing commitment to your health goals. Please review the following plan we discussed and let me know if I can assist you in the future.   Referrals/Orders/Follow-Ups/Clinician Recommendations: Aim for 30 minutes of exercise or brisk walking, 6-8 glasses of water, and 5 servings of fruits and vegetables each day.  This is a list of the screening recommended for you and due dates:  Health Maintenance  Topic Date Due   COVID-19 Vaccine (1) Never done   Zoster (Shingles) Vaccine (1 of 2) Never done   Pneumonia Vaccine (2 of 2 - PCV) 01/03/2010   Flu Shot  01/27/2023   DTaP/Tdap/Td vaccine (2 - Td or Tdap) 09/20/2023   Medicare Annual Wellness Visit  08/10/2024   HPV Vaccine  Aged Out    Advanced directives: (ACP Link)Information on Advanced Care Planning can be found at [facility] Advance Health Care Directives Advance Health Care Directives ([email or web address]   Next Medicare Annual Wellness Visit scheduled for next year: Yes

## 2023-08-11 NOTE — Progress Notes (Signed)
Subjective:   Curtis Patel is a 83 y.o. male who presents for Medicare Annual/Subsequent preventive examination.  Visit Complete: Virtual I connected with  Karlene Lineman on 08/11/23 by a audio enabled telemedicine application and verified that I am speaking with the correct person using two identifiers.  Patient Location: Home  Provider Location: Home Office  This patient declined Interactive audio and video telecommunications. Therefore the visit was completed with audio only.  I discussed the limitations of evaluation and management by telemedicine. The patient expressed understanding and agreed to proceed.  Vital Signs: Because this visit was a virtual/telehealth visit, some criteria may be missing or patient reported. Any vitals not documented were not able to be obtained and vitals that have been documented are patient reported.  Cardiac Risk Factors include: advanced age (>19men, >67 women);male gender;dyslipidemia     Objective:    Today's Vitals   08/11/23 1400  Weight: 132 lb (59.9 kg)  Height: 5\' 7"  (1.702 m)   Body mass index is 20.67 kg/m.     08/11/2023    2:02 PM 07/29/2022   10:25 AM 07/29/2021    9:58 AM 04/27/2017    8:30 AM 03/01/2017    8:08 AM  Advanced Directives  Does Patient Have a Medical Advance Directive? No Yes No Yes Yes  Type of Special educational needs teacher of [place];Living will  Healthcare Power of [place];Living will Healthcare Power of Attorney  Copy of Healthcare Power of Attorney in Chart?  No - copy requested  No - copy requested No - copy requested  Would patient like information on creating a medical advance directive? Yes (MAU/Ambulatory/Procedural Areas - Information given)  No - Patient declined      Current Medications (verified) Outpatient Encounter Medications as of 08/11/2023  Medication Sig   rosuvastatin (CRESTOR) 10 MG tablet Take 1 tablet (10 mg total) by mouth daily. (NEEDS TO BE SEEN BEFORE NEXT REFILL) (Patient  not taking: Reported on 08/11/2023)   No facility-administered encounter medications on file as of 08/11/2023.    Allergies (verified) Patient has no known allergies.   History: Past Medical History:  Diagnosis Date   Atypical mole 09/15/2010   malignant spindle cell--right nose   Basal cell carcinoma 09/15/2010   left chest curet tx 3   Bladder cancer (HCC) 03/11/2017   Frequency of urination    History of basal cell carcinoma (BCC) of skin    09-15-2010  left face   History of exercise stress test 08/26/2016   Normal echo stress w/ normal LVSF   History of malignant neoplasm of skin    09-15-2010   s/p excision spindle cell neoplasm right nose   Nocturia    Squamous cell carcinoma of skin 04/02/2019   scc in situ--left side nose--cx3, 2fu   Wears dentures    upper   Past Surgical History:  Procedure Laterality Date   CATARACT EXTRACTION Left 2000   per pt no lens implant   CYSTOSCOPY W/ URETERAL STENT PLACEMENT Bilateral 03/09/2017   Procedure: CYSTOSCOPY WITH BILATERAL RETROGRADE PYELOGRAM/ LEFT URETERAL STENT PLACEMENT;  Surgeon: Sebastian Ache, MD;  Location: [facility];  Service: Urology;  Laterality: Bilateral;   CYSTOSCOPY W/ URETERAL STENT PLACEMENT Bilateral 04/27/2017   Procedure: CYSTOSCOPY WITH RETROGRADE PYELOGRAM/URETERAL LEFT STENT REPLACEMENT;  Surgeon: Sebastian Ache, MD;  [facility];  Service: Urology;  Laterality: Bilateral;   DUPUYTREN CONTRACTURE RELEASE Left 09-22-2010   dr Merlyn Lot [facility]   left ring and small  fingers   TRANSURETHRAL RESECTION OF BLADDER TUMOR N/A 03/09/2017   Procedure: TRANSURETHRAL RESECTION OF BLADDER TUMOR (TURBT);  Surgeon: Sebastian Ache, MD;  Location: [facility];  Service: Urology;  Laterality: N/A;   TRANSURETHRAL RESECTION OF BLADDER TUMOR N/A 04/27/2017   Procedure: TRANSURETHRAL RESECTION OF BLADDER TUMOR (TURBT);  Surgeon: Sebastian Ache, MD;  Location: [facility];  Service: Urology;   Laterality: N/A;   History reviewed. No pertinent family history. Social History   Socioeconomic History   Marital status: Married    Spouse name: Glenda   Number of children: 1   Years of education: Not on file   Highest education level: Not on file  Occupational History   Not on file  Tobacco Use   Smoking status: Former    Current packs/day: 0.00    Types: Cigarettes    Start date: 04/19/1958    Quit date: 04/20/1963    Years since quitting: 60.3   Smokeless tobacco: Never  Vaping Use   Vaping status: Never Used  Substance and Sexual Activity   Alcohol use: No   Drug use: No   Sexual activity: Yes  Other Topics Concern   Not on file  Social History Narrative   1 daughter and 1 grandson   Social Drivers of Corporate investment banker Strain: Low Risk  (08/11/2023)   Overall Financial Resource Strain (CARDIA)    Difficulty of Paying Living Expenses: Not hard at all  Food Insecurity: No Food Insecurity (08/11/2023)   Hunger Vital Sign    Worried About Running Out of Food in the Last Year: Never true    Ran Out of Food in the Last Year: Never true  Transportation Needs: No Transportation Needs (08/11/2023)   PRAPARE - Administrator, Civil Service (Medical): No    Lack of Transportation (Non-Medical): No  Physical Activity: Sufficiently Active (08/11/2023)   Exercise Vital Sign    Days of Exercise per Week: 5 days    Minutes of Exercise per Session: 30 min  Stress: No Stress Concern Present (08/11/2023)   [employer] of Occupational Health - Occupational Stress Questionnaire    Feeling of Stress : Not at all  Social Connections: Socially Integrated (08/11/2023)   Social Connection and Isolation Panel [NHANES]    Frequency of Communication with Friends and Family: More than three times a week    Frequency of Social Gatherings with Friends and Family: Three times a week    Attends Religious Services: More than 4 times per year    Active Member of Clubs  or Organizations: Yes    Attends Engineer, structural: More than 4 times per year    Marital Status: Married    Tobacco Counseling Counseling given: Not Answered   Clinical Intake:  Pre-visit preparation completed: Yes  Pain : No/denies pain     Diabetes: No  How often do you need to have someone help you when you read instructions, pamphlets, or other written materials from your doctor or pharmacy?: 1 - Never  Interpreter Needed?: No  Information entered by :: Kandis Fantasia LPN   Activities of Daily Living    08/11/2023    2:01 PM  In your present state of health, do you have any difficulty performing the following activities:  Hearing? 0  Vision? 0  Difficulty concentrating or making decisions? 0  Walking or climbing stairs? 0  Dressing or bathing? 0  Doing errands, shopping? 0  Preparing Food and eating ?  N  Using the Toilet? N  In the past six months, have you accidently leaked urine? N  Do you have problems with loss of bowel control? N  Managing your Medications? N  Managing your Finances? N  Housekeeping or managing your Housekeeping? N    Patient Care Team: Mechele Claude, MD as PCP - General (Family Medicine) Janalyn Harder, MD (Inactive) as Consulting Physician (Dermatology)  Indicate any recent Medical Services you may have received from other than [facility] providers in the past year (date may be approximate).     Assessment:   This is a routine wellness examination for [place].  Hearing/Vision screen Hearing Screening - Comments:: Denies hearing difficulties   Vision Screening - Comments:: Wears rx glasses - up to date with routine eye exams with Dr. Cathey Endow     Goals Addressed   None   Depression Screen    08/11/2023    2:02 PM 07/29/2021    9:52 AM 06/25/2021    9:17 AM 10/25/2019   10:59 AM 08/17/2016   10:47 AM 07/28/2016    1:13 PM  PHQ 2/9 Scores  PHQ - 2 Score 0 0 0 0 0 0    Fall Risk    08/11/2023    2:03 PM 07/29/2022    10:22 AM 07/29/2021    9:59 AM 06/25/2021    9:17 AM 10/25/2019   10:58 AM  Fall Risk   Falls in the past year? 0 0 0 0 0  Number falls in past yr: 0 0 0    Injury with Fall? 0 0 0    Risk for fall due to : No Fall Risks No Fall Risks No Fall Risks  No Fall Risks  Follow up Falls prevention discussed;Education provided;Falls evaluation completed Falls prevention discussed Falls prevention discussed  Falls evaluation completed    MEDICARE RISK AT HOME: Medicare Risk at Home Any stairs in or around the home?: No If so, are there any without handrails?: No Home free of loose throw rugs in walkways, pet beds, electrical cords, etc?: Yes Adequate lighting in your home to reduce risk of falls?: Yes Life alert?: No Use of a cane, walker or w/c?: No Grab bars in the bathroom?: Yes Shower chair or bench in shower?: No Elevated toilet seat or a handicapped toilet?: Yes  TIMED UP AND GO:  Was the test performed?  No    Cognitive Function:        08/11/2023    2:03 PM 07/29/2022   10:25 AM 07/29/2021   10:02 AM  6CIT Screen  What Year? 0 points 0 points 0 points  What month? 0 points 0 points 0 points  What time? 0 points 0 points 0 points  Count back from 20 0 points 0 points 0 points  Months in reverse 0 points 0 points 0 points  Repeat phrase 0 points 0 points 0 points  Total Score 0 points 0 points 0 points    Immunizations Immunization History  Administered Date(s) Administered   Influenza-Unspecified 03/28/2018, 04/28/2021   Pneumococcal Polysaccharide-23 01/03/2009   Tdap 09/19/2013    TDAP status: Up to date  Flu Vaccine status: Declined, Education has been provided regarding the importance of this vaccine but patient still declined. Advised may receive this vaccine at local pharmacy or Health Dept. Aware to provide a copy of the vaccination record if obtained from local pharmacy or Health Dept. Verbalized acceptance and understanding.  Pneumococcal vaccine status:  Declined,  Education has been provided  regarding the importance of this vaccine but patient still declined. Advised may receive this vaccine at local pharmacy or Health Dept. Aware to provide a copy of the vaccination record if obtained from local pharmacy or Health Dept. Verbalized acceptance and understanding.   Covid-19 vaccine status: Declined, Education has been provided regarding the importance of this vaccine but patient still declined. Advised may receive this vaccine at local pharmacy or Health Dept.or vaccine clinic. Aware to provide a copy of the vaccination record if obtained from local pharmacy or Health Dept. Verbalized acceptance and understanding.  Qualifies for Shingles Vaccine? Yes   Zostavax completed No   Shingrix Completed?: No.    Education has been provided regarding the importance of this vaccine. Patient has been advised to call insurance company to determine out of pocket expense if they have not yet received this vaccine. Advised may also receive vaccine at local pharmacy or Health Dept. Verbalized acceptance and understanding.  Screening Tests Health Maintenance  Topic Date Due   COVID-19 Vaccine (1) Never done   Zoster Vaccines- Shingrix (1 of 2) Never done   Pneumonia Vaccine 67+ Years old (2 of 2 - PCV) 01/03/2010   INFLUENZA VACCINE  01/27/2023   DTaP/Tdap/Td (2 - Td or Tdap) 09/20/2023   Medicare Annual Wellness (AWV)  08/10/2024   HPV VACCINES  Aged Out    Health Maintenance  Health Maintenance Due  Topic Date Due   COVID-19 Vaccine (1) Never done   Zoster Vaccines- Shingrix (1 of 2) Never done   Pneumonia Vaccine 55+ Years old (2 of 2 - PCV) 01/03/2010   INFLUENZA VACCINE  01/27/2023    Colorectal cancer screening: No longer required.   Lung Cancer Screening: (Low Dose CT Chest recommended if Age 1-80 years, 20 pack-year currently smoking OR have quit w/in 15years.) does not qualify.   Lung Cancer Screening Referral: n/a  Additional  Screening:  Hepatitis C Screening: does not qualify  Vision Screening: Recommended annual ophthalmology exams for early detection of glaucoma and other disorders of the eye. Is the patient up to date with their annual eye exam?  Yes  Who is the provider or what is the name of the office in which the patient attends annual eye exams? Dr. Cathey Endow If pt is not established with a provider, would they like to be referred to a provider to establish care? No .   Dental Screening: Recommended annual dental exams for proper oral hygiene  Community Resource Referral / Chronic Care Management: CRR required this visit?  No   CCM required this visit?  No     Plan:     I have personally reviewed and noted the following in the patient's chart:   Medical and social history Use of alcohol, tobacco or illicit drugs  Current medications and supplements including opioid prescriptions. Patient is not currently taking opioid prescriptions. Functional ability and status Nutritional status Physical activity Advanced directives List of other physicians Hospitalizations, surgeries, and ER visits in previous 12 months Vitals Screenings to include cognitive, depression, and falls Referrals and appointments  In addition, I have reviewed and discussed with patient certain preventive protocols, quality metrics, and best practice recommendations. A written personalized care plan for preventive services as well as general preventive health recommendations were provided to patient.     Kandis Fantasia [place], [place]   1/61/0960   After Visit Summary: (MyChart) Due to this being a telephonic visit, the after visit summary with patients personalized plan was offered to patient  via MyChart   Nurse Notes: No concerns at this time

## 2023-08-18 LAB — COLOGUARD

## 2024-07-25 ENCOUNTER — Ambulatory Visit: Admitting: Family Medicine

## 2024-07-25 ENCOUNTER — Encounter: Payer: Self-pay | Admitting: Family Medicine

## 2024-07-25 VITALS — BP 199/72 | HR 71 | Temp 97.7°F | Ht 67.0 in | Wt 130.0 lb

## 2024-07-25 DIAGNOSIS — C679 Malignant neoplasm of bladder, unspecified: Secondary | ICD-10-CM | POA: Diagnosis not present

## 2024-07-25 DIAGNOSIS — R1084 Generalized abdominal pain: Secondary | ICD-10-CM

## 2024-07-25 NOTE — Progress Notes (Signed)
 "  Subjective:  Patient ID: Curtis Patel, male    DOB: January 17, 1941  Age: 84 y.o. MRN: [id number]  CC: gi issue (Pt reports pain in his stomach around ribs. Reoccurring. Seems to be around the ribs/large intestines per patient. Sweets really aggravated. Tender to the touch. No other GI sometimes. Delay bowel movements but sometimes goes a few days without one.  Normal stool. Not eating much cause scared pain will come back. ) and Bladder Cancer (Hx of bladder cancer 5 years ago and never followed up)   HPI  Discussed the use of AI scribe software for clinical note transcription with the patient, who gave verbal consent to proceed.  History of Present Illness [name] is an 84 year old male with a history of bladder cancer who presents with abdominal pain.  He describes the abdominal pain as a swelling sensation, tender, and primarily located in the lower abdomen. Initially, the pain started in the middle of the abdomen but has since moved lower. The pain is intermittent, lasting about five minutes, and feels like 'gas'. No pain is associated with eating, and he maintains a good appetite.  He has a history of bladder cancer treated over seven years ago with surgery.  Regarding bowel habits, his bowel movements were irregular last week, taking two to three days to have a movement, which he attributes to not eating normally due to pain. However, his bowel movements have returned to his normal schedule. He denies current constipation but mentions a period of reduced food intake last week due to pain.  He has used Pepcid, which seemed to help with the initial occurrence of pain. He notes a previous ulcer and wonders if it could be related to his current symptoms.          08/11/2023    2:02 PM 07/29/2021    9:52 AM 06/25/2021    9:17 AM  Depression screen PHQ 2/9  Decreased Interest 0 0 0  Down, Depressed, Hopeless 0 0 0  PHQ - 2 Score 0 0 0    History Curtis Patel has a past medical  history of Atypical mole (09/15/2010), Basal cell carcinoma (09/15/2010), Bladder cancer (HCC) (03/11/2017), Frequency of urination, History of basal cell carcinoma (BCC) of skin, History of exercise stress test (08/26/2016), History of malignant neoplasm of skin, Nocturia, Squamous cell carcinoma of skin (04/02/2019), and Wears dentures.   He has a past surgical history that includes Transurethral resection of bladder tumor (N/A, 03/09/2017); Cystoscopy w/ ureteral stent placement (Bilateral, 03/09/2017); Cataract extraction (Left, 2000); Dupuytren contracture release (Left, 09-22-2010   dr murrell [facility]); Transurethral resection of bladder tumor (N/A, 04/27/2017); and Cystoscopy w/ ureteral stent placement (Bilateral, 04/27/2017).   His family history is not on file.He reports that he quit smoking about 61 years ago. His smoking use included cigarettes. He started smoking about 66 years ago. He has never used smokeless tobacco. He reports that he does not drink alcohol and does not use drugs.    ROS Review of Systems  Constitutional:  Negative for fever.  Respiratory:  Negative for shortness of breath.   Cardiovascular:  Negative for chest pain.  Musculoskeletal:  Negative for arthralgias.  Skin:  Negative for rash.    Objective:  BP (!) 199/72   Pulse 71   Temp 97.7 F (36.5 C)   Ht 5' 7 (1.702 m)   Wt 130 lb (59 kg)   SpO2 96%   BMI 20.36 kg/m   BP Readings from  Last 3 Encounters:  07/25/24 (!) 199/72  07/29/21 (!) 146/80  06/25/21 (!) 193/86    Wt Readings from Last 3 Encounters:  07/25/24 130 lb (59 kg)  08/11/23 132 lb (59.9 kg)  07/29/22 132 lb (59.9 kg)     Physical Exam Physical Exam GENERAL: Alert, cooperative, well developed, no acute distress. HEENT: Normocephalic, normal oropharynx, moist mucous membranes. CHEST: Clear to auscultation bilaterally, no wheezes, rhonchi, or crackles. CARDIOVASCULAR: Normal heart rate and rhythm, S1 and S2 normal without  murmurs. ABDOMEN: Soft, non-tender, moderately distended, without organomegaly, normal bowel sounds. EXTREMITIES: No cyanosis or edema. NEUROLOGICAL: Cranial nerves grossly intact, moves all extremities without gross motor or sensory deficit.   Assessment & Plan:  Generalized abdominal pain -     CT ABDOMEN PELVIS W CONTRAST; Future -     CBC with Differential/Platelet -     CMP14+EGFR  Malignant neoplasm of urinary bladder, unspecified site [facility]) -     Ambulatory referral to Urology    Assessment and Plan Assessment & Plan Generalized abdominal pain   He experiences intermittent abdominal pain, mainly in the mid-abdomen, with occasional lower abdominal discomfort. The pain feels like bloating, possibly due to gas, and is not linked to eating or appetite changes. Differential diagnosis includes recurrence of bladder cancer, gallbladder issues, or gastrointestinal inflammation. Physical exam shows no clear cause. A CT scan of the abdomen with contrast is ordered to evaluate for bladder cancer recurrence or other abdominal pathology. He is advised to use a stool softener and fiber supplement like Metamucil for potential constipation and to monitor symptoms, reporting any changes such as diarrhea, constipation, or decreased appetite.  History of malignant neoplasm of urinary bladder   Bladder cancer was treated over seven years ago with TURBT. Abdominal pain raises concern for recurrence, though there is no current evidence. A CT scan is necessary to rule out recurrence. He is referred to urology for follow-up with Dr. Alvaro to assess for bladder cancer recurrence. CT scan results will be coordinated with urology for a comprehensive evaluation.       Follow-up: No follow-ups on file.  Butler Der, M.D. "

## 2024-07-26 LAB — CMP14+EGFR
ALT: 25 [IU]/L (ref 0–44)
AST: 26 [IU]/L (ref 0–40)
Albumin: 4.1 g/dL (ref 3.7–4.7)
Alkaline Phosphatase: 101 [IU]/L (ref 48–129)
BUN/Creatinine Ratio: 12 (ref 10–24)
BUN: 17 mg/dL (ref 8–27)
Bilirubin Total: 0.4 mg/dL (ref 0.0–1.2)
CO2: 26 mmol/L (ref 20–29)
Calcium: 9.5 mg/dL (ref 8.6–10.2)
Chloride: 102 mmol/L (ref 96–106)
Creatinine, Ser: 1.43 mg/dL — ABNORMAL HIGH (ref 0.76–1.27)
Globulin, Total: 2.3 g/dL (ref 1.5–4.5)
Glucose: 104 mg/dL — ABNORMAL HIGH (ref 70–99)
Potassium: 4.6 mmol/L (ref 3.5–5.2)
Sodium: 142 mmol/L (ref 134–144)
Total Protein: 6.4 g/dL (ref 6.0–8.5)
eGFR: 49 mL/min/{1.73_m2} — ABNORMAL LOW

## 2024-07-26 LAB — CBC WITH DIFFERENTIAL/PLATELET
Basophils Absolute: 0 10*3/uL (ref 0.0–0.2)
Basos: 1 %
EOS (ABSOLUTE): 0.1 10*3/uL (ref 0.0–0.4)
Eos: 1 %
Hematocrit: 42.9 % (ref 37.5–51.0)
Hemoglobin: 14 g/dL (ref 13.0–17.7)
Immature Grans (Abs): 0 10*3/uL (ref 0.0–0.1)
Immature Granulocytes: 0 %
Lymphocytes Absolute: 2.5 10*3/uL (ref 0.7–3.1)
Lymphs: 29 %
MCH: 28.9 pg (ref 26.6–33.0)
MCHC: 32.6 g/dL (ref 31.5–35.7)
MCV: 89 fL (ref 79–97)
Monocytes Absolute: 0.4 10*3/uL (ref 0.1–0.9)
Monocytes: 5 %
Neutrophils Absolute: 5.5 10*3/uL (ref 1.4–7.0)
Neutrophils: 64 %
Platelets: 321 10*3/uL (ref 150–450)
RBC: 4.85 x10E6/uL (ref 4.14–5.80)
RDW: 12 % (ref 11.6–15.4)
WBC: 8.5 10*3/uL (ref 3.4–10.8)

## 2024-07-30 ENCOUNTER — Ambulatory Visit: Payer: Self-pay | Admitting: Family Medicine

## 2024-07-30 NOTE — Progress Notes (Signed)
Hello Curtis Patel,  Your lab result is normal and/or stable.Some minor variations that are not significant are commonly marked abnormal, but do not represent any medical problem for you.  Best regards, Jesten Cappuccio, M.D.

## 2024-08-01 ENCOUNTER — Ambulatory Visit (HOSPITAL_BASED_OUTPATIENT_CLINIC_OR_DEPARTMENT_OTHER)
Admission: RE | Admit: 2024-08-01 | Discharge: 2024-08-01 | Disposition: A | Source: Ambulatory Visit | Attending: Family Medicine | Admitting: Family Medicine

## 2024-08-01 DIAGNOSIS — R1084 Generalized abdominal pain: Secondary | ICD-10-CM

## 2024-08-01 MED ORDER — IOHEXOL 300 MG/ML  SOLN
100.0000 mL | Freq: Once | INTRAMUSCULAR | Status: AC | PRN
  Administered 2024-08-01: 100 mL via INTRAVENOUS

## 2024-08-03 ENCOUNTER — Telehealth: Payer: Self-pay | Admitting: Family Medicine

## 2024-08-03 ENCOUNTER — Telehealth: Payer: Self-pay

## 2024-08-03 NOTE — Telephone Encounter (Signed)
 Will call back once CT is reviewed

## 2024-08-03 NOTE — Telephone Encounter (Signed)
 Multiple calls regarding this, will close this one.

## 2024-08-03 NOTE — Telephone Encounter (Signed)
 Called patient he is aware. Left message for daughter.

## 2024-08-03 NOTE — Telephone Encounter (Signed)
 Copied from CRM [id number]. Topic: Clinical - Lab/Test Results >> Aug 03, 2024  4:23 PM Susanna ORN wrote: Patient's daughter is requesting a call back TODAY from someone that can speak with her about her father's CT results. States she has called earlier & thinks that it was unprofessional that no one told her that Dr. Zollie was off and she's been waiting for a call back. Please give Curtis Patel a call back today. CB #: [phone].

## 2024-08-03 NOTE — Telephone Encounter (Signed)
 Patient daughter made aware of CT results and verbalized understanding.

## 2024-08-03 NOTE — Telephone Encounter (Signed)
 Patient is asking if someone can view his CT today. Please advise.

## 2024-08-03 NOTE — Telephone Encounter (Signed)
 Copied from CRM [id number]. Topic: Clinical - Lab/Test Results >> Aug 03, 2024 11:25 AM Delon DASEN wrote: Reason for CRM: daughter called for labs, read message from Dr Zollie verbatim, no questions- asking about CT results

## 2024-08-03 NOTE — Telephone Encounter (Signed)
 Mild right renal atrophy. This can be due to CKD. Bladder is unremarkable. No stones or hydronephrosis. Scattered small pulmonary nodules with benign appearance that do not require follow up. PCP placed referral to urologist for further evaluation of pain.

## 2024-08-03 NOTE — Telephone Encounter (Unsigned)
 Copied from CRM #[id number]. Topic: Clinical - Lab/Test Results >> Aug 03, 2024 10:31 AM Miquel SAILOR wrote: Reason for CRM: PT daughter Burnard on [facility] requesting lab results from 01/29. Needs call back how to get this. [phone]

## 2024-08-06 ENCOUNTER — Other Ambulatory Visit
